# Patient Record
Sex: Female | Born: 1945 | Race: White | Hispanic: No | Marital: Single | Smoking: Former smoker
Health system: Southern US, Community
[De-identification: ages and names within clinical notes are randomized; demographics above are authoritative.]

## PROBLEM LIST (undated history)

## (undated) DIAGNOSIS — R0602 Shortness of breath: Secondary | ICD-10-CM

## (undated) DIAGNOSIS — G20A1 Parkinson's disease without dyskinesia, without mention of fluctuations: Secondary | ICD-10-CM

## (undated) DIAGNOSIS — G20 Parkinson's disease: Secondary | ICD-10-CM

## (undated) DIAGNOSIS — M1990 Unspecified osteoarthritis, unspecified site: Secondary | ICD-10-CM

## (undated) DIAGNOSIS — H3530 Unspecified macular degeneration: Secondary | ICD-10-CM

## (undated) DIAGNOSIS — E039 Hypothyroidism, unspecified: Secondary | ICD-10-CM

## (undated) DIAGNOSIS — N390 Urinary tract infection, site not specified: Secondary | ICD-10-CM

## (undated) DIAGNOSIS — E079 Disorder of thyroid, unspecified: Secondary | ICD-10-CM

## (undated) DIAGNOSIS — K5900 Constipation, unspecified: Secondary | ICD-10-CM

## (undated) DIAGNOSIS — F419 Anxiety disorder, unspecified: Secondary | ICD-10-CM

## (undated) DIAGNOSIS — S42409A Unspecified fracture of lower end of unspecified humerus, initial encounter for closed fracture: Secondary | ICD-10-CM

## (undated) HISTORY — PX: CHOLECYSTECTOMY: SHX55

## (undated) HISTORY — PX: OTHER SURGICAL HISTORY: SHX169

---

## 2008-01-15 ENCOUNTER — Emergency Department (HOSPITAL_COMMUNITY): Admission: EM | Admit: 2008-01-15 | Discharge: 2008-01-15 | Payer: Self-pay | Admitting: Emergency Medicine

## 2013-02-27 ENCOUNTER — Emergency Department (HOSPITAL_COMMUNITY)
Admission: EM | Admit: 2013-02-27 | Discharge: 2013-02-27 | Disposition: A | Discharge status: Home / Self Care | Payer: Medicare HMO | Attending: Emergency Medicine | Admitting: Emergency Medicine

## 2013-02-27 ENCOUNTER — Emergency Department (HOSPITAL_COMMUNITY): Payer: Medicare HMO

## 2013-02-27 ENCOUNTER — Encounter (HOSPITAL_COMMUNITY): Payer: Self-pay | Admitting: Emergency Medicine

## 2013-02-27 DIAGNOSIS — Y929 Unspecified place or not applicable: Secondary | ICD-10-CM | POA: Insufficient documentation

## 2013-02-27 DIAGNOSIS — S52201A Unspecified fracture of shaft of right ulna, initial encounter for closed fracture: Secondary | ICD-10-CM

## 2013-02-27 DIAGNOSIS — G20 Parkinson's disease: Secondary | ICD-10-CM | POA: Insufficient documentation

## 2013-02-27 DIAGNOSIS — F17200 Nicotine dependence, unspecified, uncomplicated: Secondary | ICD-10-CM | POA: Insufficient documentation

## 2013-02-27 DIAGNOSIS — Z87440 Personal history of urinary (tract) infections: Secondary | ICD-10-CM | POA: Insufficient documentation

## 2013-02-27 DIAGNOSIS — E079 Disorder of thyroid, unspecified: Secondary | ICD-10-CM | POA: Insufficient documentation

## 2013-02-27 DIAGNOSIS — Z8781 Personal history of (healed) traumatic fracture: Secondary | ICD-10-CM | POA: Insufficient documentation

## 2013-02-27 DIAGNOSIS — Y9389 Activity, other specified: Secondary | ICD-10-CM | POA: Insufficient documentation

## 2013-02-27 DIAGNOSIS — S52509A Unspecified fracture of the lower end of unspecified radius, initial encounter for closed fracture: Secondary | ICD-10-CM | POA: Insufficient documentation

## 2013-02-27 DIAGNOSIS — W208XXA Other cause of strike by thrown, projected or falling object, initial encounter: Secondary | ICD-10-CM | POA: Insufficient documentation

## 2013-02-27 DIAGNOSIS — G20A1 Parkinson's disease without dyskinesia, without mention of fluctuations: Secondary | ICD-10-CM | POA: Insufficient documentation

## 2013-02-27 HISTORY — DX: Parkinson's disease without dyskinesia, without mention of fluctuations: G20.A1

## 2013-02-27 HISTORY — DX: Unspecified fracture of lower end of unspecified humerus, initial encounter for closed fracture: S42.409A

## 2013-02-27 HISTORY — DX: Parkinson's disease: G20

## 2013-02-27 HISTORY — DX: Urinary tract infection, site not specified: N39.0

## 2013-02-27 HISTORY — DX: Disorder of thyroid, unspecified: E07.9

## 2013-02-27 MED ORDER — SODIUM CHLORIDE 0.9 % IV SOLN
INTRAVENOUS | Status: DC
  Administered 2013-02-27: 21:00:00 via INTRAVENOUS

## 2013-02-27 MED ORDER — FENTANYL CITRATE 0.05 MG/ML IJ SOLN
50.0000 ug | INTRAMUSCULAR | Status: DC | PRN
  Administered 2013-02-27: 50 ug via INTRAVENOUS
  Filled 2013-02-27: qty 2

## 2013-02-27 MED ORDER — OXYCODONE-ACETAMINOPHEN 5-325 MG PO TABS: 1.0000 | ORAL_TABLET | ORAL | Status: DC | PRN

## 2013-02-27 MED ORDER — LIDOCAINE HCL 1 % IJ SOLN
10.0000 mL | Freq: Once | INTRAMUSCULAR | Status: AC
  Administered 2013-02-27: 10 mL via INTRADERMAL
  Filled 2013-02-27: qty 20

## 2013-02-27 MED ORDER — BUPIVACAINE HCL 0.25 % IJ SOLN
10.0000 mL | Freq: Once | INTRAMUSCULAR | Status: AC
  Administered 2013-02-27: 10 mL
  Filled 2013-02-27: qty 10

## 2013-02-27 MED ORDER — ONDANSETRON HCL 4 MG/2ML IJ SOLN
4.0000 mg | Freq: Once | INTRAMUSCULAR | Status: AC
  Administered 2013-02-27: 4 mg via INTRAVENOUS
  Filled 2013-02-27: qty 2

## 2013-02-27 MED ORDER — DOCUSATE SODIUM 100 MG PO CAPS
100.0000 mg | ORAL_CAPSULE | Freq: Two times a day (BID) | ORAL | Status: AC | PRN
Start: 2013-02-27 — End: ?

## 2013-02-27 NOTE — ED Notes (Signed)
Pt states she was cleaning out a cabinet and some wood stain fell out and caused her to fall  Pt injured her right wrist  Pt has a deformity noted

## 2013-02-27 NOTE — ED Provider Notes (Signed)
CSN: [id number]     Arrival date & time 02/27/13  1945 History   First MD Initiated Contact with Patient 02/27/13 2000     Chief Complaint  Patient presents with  . Fall  . Wrist Injury   (Consider location/radiation/quality/duration/timing/severity/associated sxs/prior Treatment) HPI  67 year old female with right wrist pain. Patient had mechanical fall just prior to arrival. She was organizing a closet when she lost her balance and fell. She has a history of Parkinson's disease and has baseline balance difficulty. She did not strike her head. She denies any significant pain anywhere aside from her wrist. No numbness or tingling. No intervention prior to arrival.    Past Medical History  Diagnosis Date  . Parkinson disease   . Thyroid disease   . UTI (urinary tract infection)   . Fractured elbow    Past Surgical History  Procedure Laterality Date  . Left elbow surgery    . Trigger finger x 5     . Carpel tunnel    . Cholecystectomy    . Shoulder surgery for spur removal     Family History  Problem Relation Age of Onset  . CAD Other   . Depression Other    History  Substance Use Topics  . Smoking status: Current Every Day Smoker  . Smokeless tobacco: Not on file  . Alcohol Use: No   OB History   Grav Para Term Preterm Abortions TAB SAB Ect Mult Living                 Review of Systems  All systems reviewed and negative, other than as noted in HPI.   Allergies  Review of patient's allergies indicates no known allergies.  Home Medications  No current outpatient prescriptions on file. BP 130/75  Pulse 120  Temp(Src) 98.8 F (37.1 C) (Oral)  Resp 16  Ht 5\' 6"  (1.676 m)  Wt 150 lb (68.04 kg)  BMI 24.22 kg/m2  SpO2 95% Physical Exam  Nursing note and vitals reviewed. Constitutional: She appears well-developed and well-nourished. No distress.  HENT:  Head: Normocephalic and atraumatic.  Eyes: Conjunctivae are normal. Right eye exhibits no discharge. Left  eye exhibits no discharge.  Neck: Neck supple.  Cardiovascular: Normal rate, regular rhythm and normal heart sounds.  Exam reveals no gallop and no friction rub.   No murmur heard. Pulmonary/Chest: Effort normal and breath sounds normal. No respiratory distress.  Abdominal: Soft. She exhibits no distension. There is no tenderness.  Musculoskeletal: She exhibits tenderness. She exhibits no edema.  "[place]" deformity to the right wrist. Overlying hematoma. No evidence of open injury. Decreased strength likely 2/2 pain, but otherwise NVI. No elbow tenderness or significant pain with ROM.  No midline spinal tenderness.   Neurological: She is alert.  Skin: Skin is warm and dry.  Psychiatric: She has a normal mood and affect. Her behavior is normal. Thought content normal.    ED Course  Reduction of fracture Date/Time: 02/27/2013 9:00 PM Performed by: Raeford Razor Authorized by: Raeford Razor Consent: Verbal consent obtained. Consent given by: patient Required items: required blood products, implants, devices, and special equipment available Patient identity confirmed: verbally with patient Time out: Immediately prior to procedure a "time out" was called to verify the correct patient, procedure, equipment, support staff and site/side marked as required. Local anesthesia used: yes Anesthesia: hematoma block Local anesthetic: bupivacaine 0.25% without epinephrine and lidocaine 1% without epinephrine Anesthetic total: 10 ml Patient sedated: no Patient tolerance: Patient tolerated the  procedure well with no immediate complications. Comments: Hematoma block. Manual manipulation. Finger traps.    (including critical care time) Labs Review Labs Reviewed - No data to display Imaging Review No results found.  Dg Wrist Complete Right  02/27/2013   *RADIOLOGY REPORT*  Clinical Data: Status post reduction of right wrist injury.  RIGHT WRIST - COMPLETE 3+ VIEW  Comparison: Right wrist  radiographs performed earlier today at 08:31 p.m.  Findings: The comminuted mildly impacted fracture of the distal radial metaphysis demonstrates improved alignment, though minimal dorsal displacement is still seen. Fragments demonstrate both radial and ulnar displacement.  The carpal rows articulate with the distal radial fragment.  Two ulnar styloid fragments are noted.  No new fractures identified. The soft tissues are difficult to fully assess due to the overlying splint.  IMPRESSION: Comminuted mildly impacted fracture of the distal radial metaphysis demonstrates improved alignment, though minimal dorsal displacement is still seen.  Fragments demonstrate both radial and ulnar displacement.  Two ulnar styloid fragments noted.   Original Report Authenticated By: Tonia Ghent, M.D.   Dg Wrist Complete Right  02/27/2013   CLINICAL DATA:  Fall. Wrist injury. Wrist deformity. Wrist pain.  EXAM: RIGHT WRIST - COMPLETE 3+ VIEW  COMPARISON:  None.  FINDINGS: There is a dorsally displaced transverse distal radius fracture. Dorsal displacement is 1 shaft width. The fracture is through the distal radial metaphysis and is comminuted with intra-articular extension at the distal radioulnar joint and radiocarpal joint. 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 Mildly displaced ulnar styloid fracture is present, likely acute. The exam is degraded by nonstandard projections, likely due to the fracture.  IMPRESSION: Transverse comminuted posteriorly displaced distal radius fracture with mildly displaced ulnar styloid fracture.   Electronically Signed   By: Andreas Newport M.D.   On: 02/27/2013 20:44   EKG Interpretation   None       MDM   1. Closed fracture of right radius and ulna, initial encounter     9:17 PM  66yF with R radius fx. Closed injury. Needs reduced. Ortho FU.     Raeford Razor, MD 03/03/13 [id number]

## 2013-02-27 NOTE — ED Notes (Signed)
Patient transported to X-ray 

## 2013-03-04 ENCOUNTER — Encounter (HOSPITAL_COMMUNITY): Payer: Self-pay | Admitting: *Deleted

## 2013-03-04 ENCOUNTER — Other Ambulatory Visit (HOSPITAL_COMMUNITY): Payer: Self-pay

## 2013-03-04 ENCOUNTER — Other Ambulatory Visit (HOSPITAL_COMMUNITY): Payer: Self-pay | Admitting: Orthopaedic Surgery

## 2013-03-04 ENCOUNTER — Encounter: Payer: Self-pay | Admitting: Orthopaedic Surgery

## 2013-03-04 DIAGNOSIS — S52501A Unspecified fracture of the lower end of right radius, initial encounter for closed fracture: Secondary | ICD-10-CM | POA: Insufficient documentation

## 2013-03-04 NOTE — Progress Notes (Signed)
Pt reported that she had an EKG and ECHO at [facility] in [place], approximately 8 months ago.  "They thought I had  a stroke, but I didn't. I requested D/C notes EKG and ECHO.

## 2013-03-05 ENCOUNTER — Encounter (HOSPITAL_COMMUNITY): Payer: Self-pay | Admitting: *Deleted

## 2013-03-05 ENCOUNTER — Ambulatory Visit (HOSPITAL_COMMUNITY)
Admission: RE | Admit: 2013-03-05 | Discharge: 2013-03-05 | Disposition: A | Discharge status: Home / Self Care | Payer: Medicare HMO | Source: Ambulatory Visit | Attending: Orthopaedic Surgery | Admitting: Orthopaedic Surgery

## 2013-03-05 ENCOUNTER — Encounter (HOSPITAL_COMMUNITY)
Admission: RE | Disposition: A | Discharge status: Home / Self Care | Payer: Self-pay | Source: Ambulatory Visit | Attending: Orthopaedic Surgery

## 2013-03-05 ENCOUNTER — Ambulatory Visit (HOSPITAL_COMMUNITY): Payer: Medicare HMO

## 2013-03-05 ENCOUNTER — Ambulatory Visit (HOSPITAL_COMMUNITY): Payer: Medicare HMO | Admitting: Anesthesiology

## 2013-03-05 ENCOUNTER — Encounter (HOSPITAL_COMMUNITY): Payer: Medicare HMO | Admitting: Anesthesiology

## 2013-03-05 DIAGNOSIS — G20 Parkinson's disease: Secondary | ICD-10-CM | POA: Insufficient documentation

## 2013-03-05 DIAGNOSIS — Z79899 Other long term (current) drug therapy: Secondary | ICD-10-CM | POA: Insufficient documentation

## 2013-03-05 DIAGNOSIS — G20A1 Parkinson's disease without dyskinesia, without mention of fluctuations: Secondary | ICD-10-CM | POA: Insufficient documentation

## 2013-03-05 DIAGNOSIS — S52599A Other fractures of lower end of unspecified radius, initial encounter for closed fracture: Secondary | ICD-10-CM | POA: Insufficient documentation

## 2013-03-05 DIAGNOSIS — X58XXXA Exposure to other specified factors, initial encounter: Secondary | ICD-10-CM | POA: Insufficient documentation

## 2013-03-05 DIAGNOSIS — S52501A Unspecified fracture of the lower end of right radius, initial encounter for closed fracture: Secondary | ICD-10-CM

## 2013-03-05 HISTORY — DX: Shortness of breath: R06.02

## 2013-03-05 HISTORY — DX: Hypothyroidism, unspecified: E03.9

## 2013-03-05 HISTORY — PX: OPEN REDUCTION INTERNAL FIXATION (ORIF) DISTAL RADIAL FRACTURE: SHX5989

## 2013-03-05 HISTORY — DX: Unspecified osteoarthritis, unspecified site: M19.90

## 2013-03-05 HISTORY — DX: Unspecified macular degeneration: H35.30

## 2013-03-05 HISTORY — DX: Anxiety disorder, unspecified: F41.9

## 2013-03-05 HISTORY — DX: Constipation, unspecified: K59.00

## 2013-03-05 LAB — BASIC METABOLIC PANEL
BUN: 10 mg/dL (ref 6–23)
Calcium: 8.4 mg/dL (ref 8.4–10.5)
Chloride: 104 mEq/L (ref 96–112)
Creatinine, Ser: 0.38 mg/dL — ABNORMAL LOW (ref 0.50–1.10)
GFR calc Af Amer: >90 mL/min (ref >90)
GFR calc non Af Amer: >90 mL/min (ref >90)
Glucose, Bld: 94 mg/dL (ref 70–99)

## 2013-03-05 LAB — CBC
MCH: 34.3 pg — ABNORMAL HIGH (ref 26.0–34.0)
MCV: 97.8 fL (ref 78.0–100.0)
Platelets: 255 10*3/uL (ref 150–400)
RDW: 14.8 % (ref 11.5–15.5)
WBC: 4.1 10*3/uL (ref 4.0–10.5)

## 2013-03-05 SURGERY — OPEN REDUCTION INTERNAL FIXATION (ORIF) DISTAL RADIUS FRACTURE
Anesthesia: General | Site: Wrist | Laterality: Right | Wound class: Clean

## 2013-03-05 MED ORDER — ROCURONIUM BROMIDE 100 MG/10ML IV SOLN
INTRAVENOUS | Status: DC | PRN
  Administered 2013-03-05: 30 mg via INTRAVENOUS

## 2013-03-05 MED ORDER — CEFAZOLIN SODIUM-DEXTROSE 2-3 GM-% IV SOLR
2.0000 g | INTRAVENOUS | Status: AC
  Administered 2013-03-05: 2 g via INTRAVENOUS
  Filled 2013-03-05: qty 50

## 2013-03-05 MED ORDER — FENTANYL CITRATE 0.05 MG/ML IJ SOLN
INTRAMUSCULAR | Status: DC | PRN
  Administered 2013-03-05: 100 ug via INTRAVENOUS

## 2013-03-05 MED ORDER — LACTATED RINGERS IV SOLN
INTRAVENOUS | Status: DC | PRN
  Administered 2013-03-05 (×2): via INTRAVENOUS

## 2013-03-05 MED ORDER — 0.9 % SODIUM CHLORIDE (POUR BTL) OPTIME
TOPICAL | Status: DC | PRN
  Administered 2013-03-05: 1000 mL

## 2013-03-05 MED ORDER — PROMETHAZINE HCL 25 MG PO TABS: 25.0000 mg | ORAL_TABLET | Freq: Four times a day (QID) | ORAL | Status: DC | PRN

## 2013-03-05 MED ORDER — NEOSTIGMINE METHYLSULFATE 1 MG/ML IJ SOLN
INTRAMUSCULAR | Status: DC | PRN
  Administered 2013-03-05: 3 mg via INTRAVENOUS

## 2013-03-05 MED ORDER — ONDANSETRON HCL 4 MG/2ML IJ SOLN
INTRAMUSCULAR | Status: DC | PRN
  Administered 2013-03-05: 4 mg via INTRAMUSCULAR

## 2013-03-05 MED ORDER — PHENYLEPHRINE HCL 10 MG/ML IJ SOLN
INTRAMUSCULAR | Status: DC | PRN
  Administered 2013-03-05 (×2): 80 ug via INTRAVENOUS

## 2013-03-05 MED ORDER — CELECOXIB 200 MG PO CAPS: 200.0000 mg | ORAL_CAPSULE | Freq: Two times a day (BID) | ORAL | Status: AC

## 2013-03-05 MED ORDER — LIDOCAINE HCL (CARDIAC) 20 MG/ML IV SOLN
INTRAVENOUS | Status: DC | PRN
  Administered 2013-03-05: 5 mg via INTRAVENOUS

## 2013-03-05 MED ORDER — MIDAZOLAM HCL 5 MG/5ML IJ SOLN
INTRAMUSCULAR | Status: DC | PRN
  Administered 2013-03-05: 2 mg via INTRAVENOUS

## 2013-03-05 MED ORDER — HYDROMORPHONE HCL PF 1 MG/ML IJ SOLN: 0.2500 mg | INTRAMUSCULAR | Status: DC | PRN

## 2013-03-05 MED ORDER — VITAMIN C 500 MG PO TABS: 500.0000 mg | ORAL_TABLET | Freq: Two times a day (BID) | ORAL | Status: AC

## 2013-03-05 MED ORDER — PROPOFOL 10 MG/ML IV BOLUS
INTRAVENOUS | Status: DC | PRN
  Administered 2013-03-05: 150 mg via INTRAVENOUS

## 2013-03-05 MED ORDER — OXYCODONE HCL 5 MG PO TABS: 5.0000 mg | ORAL_TABLET | ORAL | Status: DC | PRN

## 2013-03-05 MED ORDER — GLYCOPYRROLATE 0.2 MG/ML IJ SOLN
INTRAMUSCULAR | Status: DC | PRN
  Administered 2013-03-05: .5 mg via INTRAVENOUS

## 2013-03-05 MED ORDER — CALCIUM CARBONATE-VITAMIN D 500-200 MG-UNIT PO TABS: 1.0000 | ORAL_TABLET | Freq: Every day | ORAL | Status: AC

## 2013-03-05 MED ORDER — LACTATED RINGERS IV SOLN
INTRAVENOUS | Status: DC
  Administered 2013-03-05: 13:00:00 via INTRAVENOUS

## 2013-03-05 MED ORDER — HYDROCODONE-ACETAMINOPHEN 5-325 MG PO TABS: 1.0000 | ORAL_TABLET | Freq: Four times a day (QID) | ORAL | Status: AC | PRN

## 2013-03-05 SURGICAL SUPPLY — 66 items
BANDAGE ELASTIC 4 VELCRO ST LF (GAUZE/BANDAGES/DRESSINGS) ×2 IMPLANT
BANDAGE GAUZE ELAST BULKY 4 IN (GAUZE/BANDAGES/DRESSINGS) ×2 IMPLANT
BIT DRILL 2.0 LNG QUCK RELEASE (BIT) ×1 IMPLANT
BIT DRILL 2.8X5 QR DISP (BIT) ×2 IMPLANT
BNDG ESMARK 4X9 LF (GAUZE/BANDAGES/DRESSINGS) ×2 IMPLANT
CLOTH BEACON ORANGE TIMEOUT ST (SAFETY) ×2 IMPLANT
CORDS BIPOLAR (ELECTRODE) ×2 IMPLANT
COVER SURGICAL LIGHT HANDLE (MISCELLANEOUS) ×2 IMPLANT
CUFF TOURNIQUET SINGLE 18IN (TOURNIQUET CUFF) ×2 IMPLANT
CUFF TOURNIQUET SINGLE 24IN (TOURNIQUET CUFF) IMPLANT
DERMABOND ADHESIVE PROPEN (GAUZE/BANDAGES/DRESSINGS) ×1
DERMABOND ADVANCED .7 DNX6 (GAUZE/BANDAGES/DRESSINGS) ×1 IMPLANT
DRAPE C-ARM 42X72 X-RAY (DRAPES) ×2 IMPLANT
DRAPE OEC MINIVIEW 54X84 (DRAPES) ×2 IMPLANT
DRILL 2.0 LNG QUICK RELEASE (BIT) ×2
DURAPREP 26ML APPLICATOR (WOUND CARE) ×2 IMPLANT
ELECT REM PT RETURN 9FT ADLT (ELECTROSURGICAL) ×2
ELECTRODE REM PT RTRN 9FT ADLT (ELECTROSURGICAL) ×1 IMPLANT
GAUZE XEROFORM 1X8 LF (GAUZE/BANDAGES/DRESSINGS) ×2 IMPLANT
GLOVE BIO SURGEON STRL SZ8 (GLOVE) ×2 IMPLANT
GLOVE BIOGEL PI IND STRL 7.5 (GLOVE) ×2 IMPLANT
GLOVE BIOGEL PI IND STRL 8 (GLOVE) ×1 IMPLANT
GLOVE BIOGEL PI INDICATOR 7.5 (GLOVE) ×2
GLOVE BIOGEL PI INDICATOR 8 (GLOVE) ×1
GLOVE ORTHO TXT STRL SZ7.5 (GLOVE) ×2 IMPLANT
GOWN PREVENTION PLUS LG XLONG (DISPOSABLE) IMPLANT
GOWN PREVENTION PLUS XLARGE (GOWN DISPOSABLE) ×4 IMPLANT
GOWN STRL NON-REIN LRG LVL3 (GOWN DISPOSABLE) ×2 IMPLANT
GUIDEWIRE ORTHO 0.054X6 (WIRE) ×4 IMPLANT
KIT BASIN OR (CUSTOM PROCEDURE TRAY) ×2 IMPLANT
KIT ROOM TURNOVER OR (KITS) ×2 IMPLANT
MANIFOLD NEPTUNE II (INSTRUMENTS) ×2 IMPLANT
NEEDLE 22X1 1/2 (OR ONLY) (NEEDLE) IMPLANT
NS IRRIG 1000ML POUR BTL (IV SOLUTION) ×2 IMPLANT
PACK ORTHO EXTREMITY (CUSTOM PROCEDURE TRAY) ×2 IMPLANT
PAD ARMBOARD 7.5X6 YLW CONV (MISCELLANEOUS) ×4 IMPLANT
PAD CAST 4YDX4 CTTN HI CHSV (CAST SUPPLIES) ×1 IMPLANT
PADDING CAST COTTON 4X4 STRL (CAST SUPPLIES) ×1
PLATE R NARROW PROC VDR (Plate) ×2 IMPLANT
SCREW 2.3X12MM (Screw) ×2 IMPLANT
SCREW BN FT 16X2.3XLCK HEX CRT (Screw) ×1 IMPLANT
SCREW CORT FT 18X2.3XLCK HEX (Screw) ×2 IMPLANT
SCREW CORT FT 20X2.3XLCK HEX (Screw) ×1 IMPLANT
SCREW CORTICAL LOCKING 2.3X14M (Screw) ×2 IMPLANT
SCREW CORTICAL LOCKING 2.3X16M (Screw) ×1 IMPLANT
SCREW CORTICAL LOCKING 2.3X18M (Screw) ×2 IMPLANT
SCREW CORTICAL LOCKING 2.3X20M (Screw) ×1 IMPLANT
SCREW HEXALOBE NON-LOCK 3.5X16 (Screw) ×2 IMPLANT
SCREW NON TOGG 2.3X18MM (Screw) ×2 IMPLANT
SCREW NONLOCK HEX 3.5X12 (Screw) ×6 IMPLANT
SPONGE GAUZE 4X4 12PLY (GAUZE/BANDAGES/DRESSINGS) ×2 IMPLANT
SPONGE LAP 4X18 X RAY DECT (DISPOSABLE) ×2 IMPLANT
STRIP CLOSURE SKIN 1/2X4 (GAUZE/BANDAGES/DRESSINGS) ×2 IMPLANT
SUCTION FRAZIER TIP 10 FR DISP (SUCTIONS) ×2 IMPLANT
SUT MNCRL AB 4-0 PS2 18 (SUTURE) ×2 IMPLANT
SUT PROLENE 3 0 PS 1 (SUTURE) ×2 IMPLANT
SUT VIC AB 2-0 CT1 27 (SUTURE) ×1
SUT VIC AB 2-0 CT1 TAPERPNT 27 (SUTURE) ×1 IMPLANT
SUT VIC AB 3-0 X1 27 (SUTURE) ×2 IMPLANT
SUT VICRYL 4-0 PS2 18IN ABS (SUTURE) IMPLANT
SYR CONTROL 10ML LL (SYRINGE) IMPLANT
TOWEL OR 17X24 6PK STRL BLUE (TOWEL DISPOSABLE) ×2 IMPLANT
TOWEL OR 17X26 10 PK STRL BLUE (TOWEL DISPOSABLE) ×2 IMPLANT
TUBE CONNECTING 12X1/4 (SUCTIONS) ×2 IMPLANT
UNDERPAD 30X30 INCONTINENT (UNDERPADS AND DIAPERS) ×2 IMPLANT
WATER STERILE IRR 1000ML POUR (IV SOLUTION) ×2 IMPLANT

## 2013-03-05 NOTE — Anesthesia Preprocedure Evaluation (Addendum)
Anesthesia Evaluation  Patient identified by MRN, date of birth, ID band Patient awake    Reviewed: H&P , Patient's Chart, lab work & pertinent test results  Airway Mallampati: I TM Distance: >3 FB Neck ROM: Full    Dental  (+) Edentulous Upper and Edentulous Lower   Pulmonary    Pulmonary exam normal       Cardiovascular Rhythm:Regular Rate:Normal     Neuro/Psych  Neuromuscular disease    GI/Hepatic   Endo/Other  Hypothyroidism   Renal/GU      Musculoskeletal   Abdominal Normal abdominal exam  (+)   Peds  Hematology   Anesthesia Other Findings   Reproductive/Obstetrics                           Anesthesia Physical Anesthesia Plan  ASA: III  Anesthesia Plan:    Post-op Pain Management:    Induction:   Airway Management Planned:   Additional Equipment:   Intra-op Plan:   Post-operative Plan:   Informed Consent:   Plan Discussed with:   Anesthesia Plan Comments:         Anesthesia Quick Evaluation

## 2013-03-05 NOTE — Anesthesia Postprocedure Evaluation (Signed)
  Anesthesia Post-op Note  Patient: Angel Bass  Procedure(s) Performed: Procedure(s): OPEN REDUCTION INTERNAL FIXATION (ORIF) RIGHT DISTAL RADIUS FRACTURE (Right)  Patient Location: PACU  Anesthesia Type:General  Level of Consciousness: awake, oriented, sedated and patient cooperative  Airway and Oxygen Therapy: Patient Spontanous Breathing  Post-op Pain: mild  Post-op Assessment: Post-op Vital signs reviewed, Patient's Cardiovascular Status Stable, Respiratory Function Stable, Patent Airway, No signs of Nausea or vomiting and Pain level controlled  Post-op Vital Signs: stable  Complications: No apparent anesthesia complications

## 2013-03-05 NOTE — Anesthesia Procedure Notes (Addendum)
Procedure Name: Intubation Date/Time: 03/05/2013 2:55 PM Performed by: Gwenyth Allegra Pre-anesthesia Checklist: Patient identified, Timeout performed, Emergency Drugs available, Suction available and Patient being monitored Patient Re-evaluated:Patient Re-evaluated prior to inductionOxygen Delivery Method: Circle system utilized Preoxygenation: Pre-oxygenation with 100% oxygen Intubation Type: IV induction Ventilation: Oral airway inserted - appropriate to patient size Laryngoscope Size: Mac and 3 Grade View: Grade I Tube size: 7.0 mm Number of attempts: 1 Airway Equipment and Method: Stylet Placement Confirmation: ETT inserted through vocal cords under direct vision,  breath sounds checked- equal and bilateral and positive ETCO2 Secured at: 21 cm Tube secured with: Tape Dental Injury: Teeth and Oropharynx as per pre-operative assessment     Anesthesia Regional Block:  Supraclavicular block  Pre-Anesthetic Checklist: ,, timeout performed, Correct Patient, Correct Site, Correct Laterality, Correct Procedure, Correct Position, site marked, Risks and benefits discussed,  Surgical consent,  Pre-op evaluation,  At surgeon's request and post-op pain management  Laterality: Left  Prep: chloraprep       Needles:   Needle Type: Echogenic Stimulator Needle     Needle Length:cm 9 cm Needle Gauge: 22 and 22 G    Additional Needles:  Procedures: ultrasound guided (picture in chart) and nerve stimulator Supraclavicular block  Nerve Stimulator or Paresthesia:  Response: 1 mA,   Additional Responses:   Narrative:  Start time: 03/05/2013 2:40 PM End time: 03/05/2013 2:45 PM Injection made incrementally with aspirations every 5 mL.  Performed by: Personally   Additional Notes: 30 cc 0.5% marcaine 1:200 epi with 4 mg decadron injected easily.

## 2013-03-05 NOTE — Transfer of Care (Signed)
Immediate Anesthesia Transfer of Care Note  Patient: Angel Bass  Procedure(s) Performed: Procedure(s): OPEN REDUCTION INTERNAL FIXATION (ORIF) RIGHT DISTAL RADIUS FRACTURE (Right)  Patient Location: PACU  Anesthesia Type:General and GA combined with regional for post-op pain  Level of Consciousness: awake, alert  and oriented  Airway & Oxygen Therapy: Patient Spontanous Breathing and Patient connected to nasal cannula oxygen  Post-op Assessment: Report given to PACU RN and Post -op Vital signs reviewed and stable  Post vital signs: Reviewed and stable  Complications: No apparent anesthesia complications

## 2013-03-05 NOTE — Preoperative (Signed)
Beta Blockers   Reason not to administer Beta Blockers:Not Applicable 

## 2013-03-05 NOTE — H&P (Signed)
PREOPERATIVE H&P  Chief Complaint: Right distal radius fracture  HPI: Angel Bass is a 67 y.o. female who presents for operative treatment of her right distal radius fracture.  She has elected for surgical management.   Past Medical History  Diagnosis Date  . Parkinson disease   . Thyroid disease   . UTI (urinary tract infection)   . Fractured elbow   . Hypothyroidism   . Anxiety   . Shortness of breath     with exertion  . Constipation   . Arthritis   . Macular degeneration disease    Past Surgical History  Procedure Laterality Date  . Left elbow surgery    . Trigger finger x 5       thumb- both,   . Carpel tunnel Right   . Cholecystectomy    . Shoulder surgery for spur removal Right    History   Social History  . Marital Status: Single    Spouse Name: N/A    Number of Children: N/A  . Years of Education: N/A   Social History Main Topics  . Smoking status: Current Every Day Smoker -- 0.50 packs/day for 40 years  . Smokeless tobacco: None  . Alcohol Use: No  . Drug Use: No  . Sexual Activity: None   Other Topics Concern  . None   Social History Narrative  . None   Family History  Problem Relation Age of Onset  . CAD Other   . Depression Other    No Known Allergies Prior to Admission medications   Medication Sig Start Date End Date Taking? Authorizing Provider  ALPRAZolam (XANAX) 0.25 MG tablet Take 0.25 mg by mouth 3 (three) times daily as needed for sleep.   Yes Historical Provider, MD  aspirin 325 MG tablet Take 325 mg by mouth daily.   Yes Historical Provider, MD  calcium carbonate (OS-CAL) 600 MG TABS tablet Take 600 mg by mouth 2 (two) times daily with a meal.   Yes Historical Provider, MD  carbidopa-levodopa (SINEMET IR) 25-100 MG per tablet Take 11 tablets by mouth daily. 3 at 7 am, 3 @ 3  @ 11 am, 3 at 3 pm and   3 @@ 6 pm   Yes Historical Provider, MD  Cyanocobalamin (VITAMIN B 12 PO) Take 1 tablet by mouth daily.   Yes Historical Provider, MD   docusate sodium (COLACE) 100 MG capsule Take 1 capsule (100 mg total) by mouth 2 (two) times daily as needed for constipation. 02/27/13  Yes Raeford Razor, MD  ibuprofen (ADVIL,MOTRIN) 200 MG tablet Take 400 mg by mouth every 6 (six) hours as needed for pain.   Yes Historical Provider, MD  levothyroxine (SYNTHROID, LEVOTHROID) 100 MCG tablet Take 100 mcg by mouth daily before breakfast.   Yes Historical Provider, MD  Multiple Vitamins-Minerals (ICAPS PO) Take 1 tablet by mouth daily.   Yes Historical Provider, MD  OVER THE COUNTER MEDICATION Take 2 tablets by mouth every 4 (four) hours as needed (sinus congestion). [employer] sinus medication   Yes Historical Provider, MD  OVER THE COUNTER MEDICATION Take 2 tablets by mouth as needed (for constipation). [employer] laxative   Yes Historical Provider, MD  oxyCODONE-acetaminophen (PERCOCET/ROXICET) 5-325 MG per tablet Take 1-2 tablets by mouth every 4 (four) hours as needed for pain. 02/27/13  Yes Raeford Razor, MD  Probiotic Product (PROBIOTIC DAILY PO) Take 1 capsule by mouth daily.   Yes Historical Provider, MD     Positive ROS: All  other systems have been reviewed and were otherwise negative with the exception of those mentioned in the HPI and as above.  Physical Exam: General: Alert, no acute distress Cardiovascular: No pedal edema Respiratory: No cyanosis, no use of accessory musculature GI: No organomegaly, abdomen is soft and non-tender Skin: No lesions in the area of chief complaint Neurologic: Sensation intact distally Psychiatric: Patient is competent for consent with normal mood and affect Lymphatic: No axillary or cervical lymphadenopathy  MUSCULOSKELETAL:  Well fitting splint. Fingers wwp. Hand NVI. No signs of CTS.  Assessment: Right distal radius fracture  Plan: Plan for Procedure(s): OPEN REDUCTION INTERNAL FIXATION (ORIF) RIGHT DISTAL RADIUS FRACTURE  The risks benefits and alternatives were discussed with the  patient including but not limited to the risks of nonoperative treatment, versus surgical intervention including infection, bleeding, nerve injury,  blood clots, cardiopulmonary complications, morbidity, mortality, among others, and they were willing to proceed.   Cheral Almas, MD   03/05/2013 1:06 PM

## 2013-03-05 NOTE — Op Note (Signed)
   DATE OF SURGERY: 03/05/2013  PREOPERATIVE DIAGNOSIS: Distal radius fracture, right   POSTOPERATIVE DIAGNOSIS: same  PROCEDURE: Open treatment of intraarticular right distal radius, fixation of 3 or more fragments. CPT [phone]   IMPLANT:   Implant Name Type Inv. Item Serial No. Manufacturer Lot No. LRB No. Used  right, narrow proximal vdr plate    [employer]  Right 1  SCREW NON TOGG 2.3X18MM - [id number] Screw [id number]  [employer]  Right 1  SCREW 2.3X12MM - [id number] Screw 3927  [employer]  Right 1  SCREW CORTICAL LOCKING 2.3X14M - [id number] Screw 2602  [employer]  Right 1  SCREW CORTICAL LOCKING 2.3X16M - [id number] Screw 2603  [employer]  Right 1  SCREW CORTICAL LOCKING 2.3X18M - [id number] Screw 6803  [employer]  Right 2  SCREW CORTICAL LOCKING 2.3X20M - [id number] Screw 6804  [employer]  Right 1  SCREW NONLOCK HEX 3.5X12 - [id number] Screw 8413  [employer]  Right 3  SCREW HEXALOBE NON-LOCK 3.5X16 - [id number] Screw 4179   [employer]   Right 1     SURGEON: Surgeon(s) and Role:    * Naiping Glee Arvin, MD - Primary  ANESTHESIA: Choice  TOURNIQUET TIME: 61 minutes  BLOOD LOSS: Minimal.  COMPLICATIONS: None.  PATHOLOGY: None.  TIME OUT: Performed before the start of procedure.  INDICATIONS: The patient is a 67 y.o. female who presented with a displaced distal radius fracture indicated for operative fixation.  DESCRIPTION OF PROCEDURE:  The patient was placed in the supine position. Prophylactic antibiotics were administered prior to incision.  A well padded tourniquet was inflated.  The extremity was prepped and draped in standard sterile fashion. The extremity was exsanguinated, tourniquet inflated to 250 mmHg.  A FCR approach was used.  Dissection through the sheath of FCR was carried out and the FCR tendon was retracted. The floor of FCR sheath was released. Flexor tendons and median nerve were retracted ulnarly and protected. Pronator quadratus was released from distal radius.  Fracture lines were visualized. Fracture was reduced under fluoroscopy. A volar locking plate was applied to the volar surface of the distal radius. Seven distal locking screws and 3 shaft screws were inserted. Fluoroscopy was used to show adequate reduction. The tourniquet was deflated. Hemostasis was achieved. The wound was irrigated. Incision closed in layers. Sterile dressing applied. Wrist immobilized in a cast. The patient was transferred to the recovery room in stable condition after all counts were correct.  POSTOPERATIVE PLAN: The patient will remain in the cast until instructed. Sutures will be removed in two weeks. About four weeks after surgery, will start wrist range of motion exercises, but in the meantime before then she will start aggressive finger range of motion exercises without resistance.  The "six pack of hand exercises" handout was given to the patient.  Mayra Reel, MD [facility] [phone] 4:53 PM

## 2013-03-11 ENCOUNTER — Encounter (HOSPITAL_COMMUNITY): Payer: Self-pay | Admitting: Orthopaedic Surgery

## 2014-05-09 ENCOUNTER — Emergency Department (HOSPITAL_COMMUNITY): Payer: Medicare HMO

## 2014-05-09 ENCOUNTER — Inpatient Hospital Stay (HOSPITAL_COMMUNITY)
Admission: EM | Admit: 2014-05-09 | Discharge: 2014-05-12 | DRG: 177 | Disposition: A | Discharge status: Home / Self Care | Payer: Medicare HMO | Attending: Internal Medicine | Admitting: Internal Medicine

## 2014-05-09 ENCOUNTER — Encounter (HOSPITAL_COMMUNITY): Payer: Self-pay | Admitting: Emergency Medicine

## 2014-05-09 DIAGNOSIS — E876 Hypokalemia: Secondary | ICD-10-CM | POA: Diagnosis present

## 2014-05-09 DIAGNOSIS — B379 Candidiasis, unspecified: Secondary | ICD-10-CM | POA: Diagnosis present

## 2014-05-09 DIAGNOSIS — Z66 Do not resuscitate: Secondary | ICD-10-CM | POA: Diagnosis present

## 2014-05-09 DIAGNOSIS — Z6823 Body mass index (BMI) 23.0-23.9, adult: Secondary | ICD-10-CM | POA: Diagnosis not present

## 2014-05-09 DIAGNOSIS — E43 Unspecified severe protein-calorie malnutrition: Secondary | ICD-10-CM | POA: Diagnosis present

## 2014-05-09 DIAGNOSIS — H3530 Unspecified macular degeneration: Secondary | ICD-10-CM | POA: Diagnosis present

## 2014-05-09 DIAGNOSIS — F17210 Nicotine dependence, cigarettes, uncomplicated: Secondary | ICD-10-CM | POA: Diagnosis present

## 2014-05-09 DIAGNOSIS — Z993 Dependence on wheelchair: Secondary | ICD-10-CM | POA: Diagnosis not present

## 2014-05-09 DIAGNOSIS — R1311 Dysphagia, oral phase: Secondary | ICD-10-CM | POA: Diagnosis present

## 2014-05-09 DIAGNOSIS — R4781 Slurred speech: Secondary | ICD-10-CM

## 2014-05-09 DIAGNOSIS — Z8673 Personal history of transient ischemic attack (TIA), and cerebral infarction without residual deficits: Secondary | ICD-10-CM

## 2014-05-09 DIAGNOSIS — N390 Urinary tract infection, site not specified: Secondary | ICD-10-CM | POA: Diagnosis present

## 2014-05-09 DIAGNOSIS — J690 Pneumonitis due to inhalation of food and vomit: Principal | ICD-10-CM | POA: Diagnosis present

## 2014-05-09 DIAGNOSIS — E039 Hypothyroidism, unspecified: Secondary | ICD-10-CM | POA: Diagnosis present

## 2014-05-09 DIAGNOSIS — D649 Anemia, unspecified: Secondary | ICD-10-CM | POA: Diagnosis present

## 2014-05-09 DIAGNOSIS — Z79899 Other long term (current) drug therapy: Secondary | ICD-10-CM

## 2014-05-09 DIAGNOSIS — Z87440 Personal history of urinary (tract) infections: Secondary | ICD-10-CM

## 2014-05-09 DIAGNOSIS — G4089 Other seizures: Secondary | ICD-10-CM | POA: Diagnosis present

## 2014-05-09 DIAGNOSIS — R0602 Shortness of breath: Secondary | ICD-10-CM | POA: Diagnosis not present

## 2014-05-09 DIAGNOSIS — M1990 Unspecified osteoarthritis, unspecified site: Secondary | ICD-10-CM | POA: Diagnosis present

## 2014-05-09 DIAGNOSIS — J189 Pneumonia, unspecified organism: Secondary | ICD-10-CM | POA: Diagnosis present

## 2014-05-09 DIAGNOSIS — G20 Parkinson's disease: Secondary | ICD-10-CM | POA: Diagnosis present

## 2014-05-09 DIAGNOSIS — R1313 Dysphagia, pharyngeal phase: Secondary | ICD-10-CM | POA: Diagnosis present

## 2014-05-09 DIAGNOSIS — R569 Unspecified convulsions: Secondary | ICD-10-CM

## 2014-05-09 LAB — URINALYSIS, ROUTINE W REFLEX MICROSCOPIC
Bilirubin Urine: NEGATIVE
Glucose, UA: NEGATIVE mg/dL
Hgb urine dipstick: NEGATIVE
KETONES UR: NEGATIVE mg/dL
Nitrite: POSITIVE — AB
PROTEIN: NEGATIVE mg/dL
Specific Gravity, Urine: 1.02 (ref 1.005–1.030)
Urobilinogen, UA: 0.2 mg/dL (ref 0.0–1.0)
pH: 7 (ref 5.0–8.0)

## 2014-05-09 LAB — COMPREHENSIVE METABOLIC PANEL
ALT: <5 U/L (ref 0–35)
ANION GAP: 14 (ref 5–15)
Albumin: 2.9 g/dL — ABNORMAL LOW (ref 3.5–5.2)
Alkaline Phosphatase: 110 U/L (ref 39–117)
BILIRUBIN TOTAL: 0.4 mg/dL (ref 0.3–1.2)
BUN: 11 mg/dL (ref 6–23)
CALCIUM: 8.9 mg/dL (ref 8.4–10.5)
CO2: 25 meq/L (ref 19–32)
CREATININE: 0.45 mg/dL — AB (ref 0.50–1.10)
Chloride: 97 mEq/L (ref 96–112)
Glucose, Bld: 115 mg/dL — ABNORMAL HIGH (ref 70–99)
Potassium: 3.6 mEq/L — ABNORMAL LOW (ref 3.7–5.3)
Sodium: 136 mEq/L — ABNORMAL LOW (ref 137–147)
Total Protein: 6.3 g/dL (ref 6.0–8.3)

## 2014-05-09 LAB — CBC WITH DIFFERENTIAL/PLATELET
Basophils Absolute: 0 10*3/uL (ref 0.0–0.1)
Basophils Relative: 0 % (ref 0–1)
EOS PCT: 7 % — AB (ref 0–5)
Eosinophils Absolute: 0.3 10*3/uL (ref 0.0–0.7)
HCT: 27.8 % — ABNORMAL LOW (ref 36.0–46.0)
Hemoglobin: 9.3 g/dL — ABNORMAL LOW (ref 12.0–15.0)
LYMPHS PCT: 18 % (ref 12–46)
Lymphs Abs: 0.7 10*3/uL (ref 0.7–4.0)
MCH: 31.5 pg (ref 26.0–34.0)
MCHC: 33.5 g/dL (ref 30.0–36.0)
MCV: 94.2 fL (ref 78.0–100.0)
Monocytes Absolute: 0.2 10*3/uL (ref 0.1–1.0)
Monocytes Relative: 5 % (ref 3–12)
NEUTROS PCT: 70 % (ref 43–77)
Neutro Abs: 2.5 10*3/uL (ref 1.7–7.7)
Platelets: 557 10*3/uL — ABNORMAL HIGH (ref 150–400)
RBC: 2.95 MIL/uL — ABNORMAL LOW (ref 3.87–5.11)
RDW: 13.5 % (ref 11.5–15.5)
WBC: 3.6 10*3/uL — AB (ref 4.0–10.5)

## 2014-05-09 LAB — POC OCCULT BLOOD, ED: Fecal Occult Bld: NEGATIVE

## 2014-05-09 LAB — URINE MICROSCOPIC-ADD ON

## 2014-05-09 MED ORDER — DEXTROSE 5 % IV SOLN
1.0000 g | Freq: Once | INTRAVENOUS | Status: AC
  Administered 2014-05-09: 1 g via INTRAVENOUS
  Filled 2014-05-09: qty 10

## 2014-05-09 MED ORDER — CARBIDOPA-LEVODOPA 25-100 MG PO TABS
3.0000 | ORAL_TABLET | ORAL | Status: DC
  Administered 2014-05-10 – 2014-05-12 (×11): 3 via ORAL
  Filled 2014-05-09 (×13): qty 3

## 2014-05-09 MED ORDER — IOHEXOL 300 MG/ML  SOLN
80.0000 mL | Freq: Once | INTRAMUSCULAR | Status: AC | PRN
  Administered 2014-05-09: 80 mL via INTRAVENOUS

## 2014-05-09 MED ORDER — LEVOTHYROXINE SODIUM 100 MCG PO TABS
100.0000 ug | ORAL_TABLET | Freq: Every day | ORAL | Status: DC
  Administered 2014-05-10 – 2014-05-11 (×2): 100 ug via ORAL
  Filled 2014-05-09 (×4): qty 1

## 2014-05-09 MED ORDER — SODIUM CHLORIDE 0.9 % IV BOLUS (SEPSIS)
500.0000 mL | Freq: Once | INTRAVENOUS | Status: AC
  Administered 2014-05-09: 500 mL via INTRAVENOUS

## 2014-05-09 MED ORDER — DOCUSATE SODIUM 100 MG PO CAPS
100.0000 mg | ORAL_CAPSULE | Freq: Two times a day (BID) | ORAL | Status: DC | PRN
  Filled 2014-05-09: qty 1

## 2014-05-09 MED ORDER — ENSURE COMPLETE PO LIQD
237.0000 mL | Freq: Two times a day (BID) | ORAL | Status: DC
  Administered 2014-05-10 – 2014-05-11 (×2): 237 mL via ORAL

## 2014-05-09 MED ORDER — POTASSIUM CHLORIDE 10 MEQ/100ML IV SOLN
10.0000 meq | INTRAVENOUS | Status: AC
  Administered 2014-05-10 (×2): 10 meq via INTRAVENOUS
  Filled 2014-05-09 (×3): qty 100

## 2014-05-09 MED ORDER — DEXTROSE-NACL 5-0.45 % IV SOLN: INTRAVENOUS | Status: AC

## 2014-05-09 MED ORDER — SODIUM CHLORIDE 0.9 % IV SOLN
INTRAVENOUS | Status: DC
  Administered 2014-05-10: 01:00:00 via INTRAVENOUS

## 2014-05-09 MED ORDER — TRAMADOL HCL 50 MG PO TABS: 50.0000 mg | ORAL_TABLET | Freq: Three times a day (TID) | ORAL | Status: DC | PRN

## 2014-05-09 MED ORDER — ATORVASTATIN CALCIUM 10 MG PO TABS
10.0000 mg | ORAL_TABLET | Freq: Every day | ORAL | Status: DC
  Administered 2014-05-10 – 2014-05-12 (×3): 10 mg via ORAL
  Filled 2014-05-09 (×3): qty 1

## 2014-05-09 MED ORDER — ALPRAZOLAM 0.25 MG PO TABS
0.2500 mg | ORAL_TABLET | Freq: Every evening | ORAL | Status: DC | PRN
  Administered 2014-05-10 – 2014-05-11 (×2): 0.25 mg via ORAL
  Filled 2014-05-09 (×2): qty 1

## 2014-05-09 MED ORDER — DEXTROSE 5 % IV SOLN
500.0000 mg | Freq: Once | INTRAVENOUS | Status: AC
  Administered 2014-05-09: 500 mg via INTRAVENOUS
  Filled 2014-05-09: qty 500

## 2014-05-09 MED ORDER — HYDROCODONE-ACETAMINOPHEN 5-325 MG PO TABS
1.0000 | ORAL_TABLET | Freq: Four times a day (QID) | ORAL | Status: DC | PRN
Start: 2014-05-09 — End: 2014-05-12

## 2014-05-09 MED ORDER — PIPERACILLIN-TAZOBACTAM 3.375 G IVPB
3.3750 g | Freq: Three times a day (TID) | INTRAVENOUS | Status: DC
  Administered 2014-05-10: 3.375 g via INTRAVENOUS
  Filled 2014-05-09 (×2): qty 50

## 2014-05-09 MED ORDER — DEXTROSE 5 % IV SOLN
500.0000 mg | INTRAVENOUS | Status: DC
  Administered 2014-05-10 – 2014-05-11 (×2): 500 mg via INTRAVENOUS
  Filled 2014-05-09 (×2): qty 500

## 2014-05-09 NOTE — ED Notes (Addendum)
Patient's son in law approached [facility] desk stating he thinks his mother-in-law had a stroke as they were driving.  Patient had become suddenly weak and her speech became slurred.

## 2014-05-09 NOTE — ED Provider Notes (Signed)
CSN: [id number]     Arrival date & time 05/09/14  1556 History   First MD Initiated Contact with Patient 05/09/14 1609     Chief Complaint  Patient presents with  . Weakness     (Consider location/radiation/quality/duration/timing/severity/associated sxs/prior Treatment) HPI Comments: Patient with a history of Parkinson's presents with a episode of altered mental status. Her son-in-law is with her states that over the last 3-4 weeks she's had some URI type symptoms and had diminished appetite. She hasn't been eating well on his last a fair amount of weight over that time. She's had some cough and chest congestion. She's had generalized weakness. While they were driving today to pick up his knees, she had an episode where she became unresponsive and couldn't get her words out. This lasted about 5 seconds. She was awake at the time but nonresponsive. She didn't have any known facial drooping. There was not any new unilateral extremity weakness but she was able to talk. She is back to baseline now. She denies having any episodes like this in the past. She has chronic worsening lower extremity weakness that's at her baseline. She has difficulty walking at baseline and states that normally she needs to be in a wheelchair. This is unchanged. She has chronic numbness in her legs which is also unchanged from her baseline. She has a chronic tremor to her upper extremities and chronic weakness in her upper extremities which is unchanged from her baseline. She denies any headache. She denies any dysuria but has had a history of recurrent UTIs and was started on an antibiotic for a UTI yesterday which was Bactrim.. She denies any fevers or chills.   Past Medical History  Diagnosis Date  . Parkinson disease   . Thyroid disease   . UTI (urinary tract infection)   . Fractured elbow   . Hypothyroidism   . Anxiety   . Shortness of breath     with exertion  . Constipation   . Arthritis   . Macular  degeneration disease    Past Surgical History  Procedure Laterality Date  . Left elbow surgery    . Trigger finger x 5       thumb- both,   . Carpel tunnel Right   . Cholecystectomy    . Shoulder surgery for spur removal Right   . Open reduction internal fixation (orif) distal radial fracture Right 03/05/2013    Procedure: OPEN REDUCTION INTERNAL FIXATION (ORIF) RIGHT DISTAL RADIUS FRACTURE;  Surgeon: Cheral Almas, MD;  Location: [facility] OR;  Service: Orthopedics;  Laterality: Right;   Family History  Problem Relation Age of Onset  . CAD Other   . Depression Other    History  Substance Use Topics  . Smoking status: Current Every Day Smoker -- 0.50 packs/day for 40 years  . Smokeless tobacco: Not on file  . Alcohol Use: No   OB History    No data available     Review of Systems  Constitutional: Positive for appetite change and fatigue. Negative for fever, chills and diaphoresis.  HENT: Positive for congestion. Negative for rhinorrhea and sneezing.   Eyes: Negative.   Respiratory: Positive for cough. Negative for chest tightness and shortness of breath.   Cardiovascular: Negative for chest pain and leg swelling.  Gastrointestinal: Positive for nausea and vomiting. Negative for abdominal pain, diarrhea and blood in stool.       Decreased appetite, with occasional nausea and vomiting  Genitourinary: Negative for frequency, hematuria, flank  pain and difficulty urinating.  Musculoskeletal: Negative for back pain and arthralgias.  Skin: Negative for rash.  Neurological: Positive for weakness (ggeneralized but worse in the lower extremities) and numbness. Negative for dizziness, speech difficulty and headaches.      Allergies  Review of patient's allergies indicates no known allergies.  Home Medications   Prior to Admission medications   Medication Sig Start Date End Date Taking? Authorizing Provider  atorvastatin (LIPITOR) 10 MG tablet Take 10 mg by mouth daily.   Yes  Historical Provider, MD  calcium carbonate (OS-CAL) 600 MG TABS tablet Take 600 mg by mouth 2 (two) times daily with a meal.   Yes Historical Provider, MD  carbidopa-levodopa (SINEMET IR) 25-100 MG per tablet Take 3 tablets by mouth 4 (four) times daily. Take 3 Tablets at 7:30 am, Take 3 Tablets at 11:30 am, Take 3 Tablets at 2:30 pm & Take 3 Tablets at 5:30 pm.   Yes Historical Provider, MD  Cyanocobalamin (VITAMIN B 12 PO) Take 1 tablet by mouth daily.   Yes Historical Provider, MD  ibuprofen (ADVIL,MOTRIN) 200 MG tablet Take 400 mg by mouth daily.    Yes Historical Provider, MD  levothyroxine (SYNTHROID, LEVOTHROID) 100 MCG tablet Take 100 mcg by mouth daily before breakfast.   Yes Historical Provider, MD  Multiple Vitamins-Minerals (ICAPS PO) Take 1 tablet by mouth daily.   Yes Historical Provider, MD  Probiotic Product (PROBIOTIC DAILY PO) Take 1 capsule by mouth daily.   Yes Historical Provider, MD  sulfamethoxazole-trimethoprim (BACTRIM DS,SEPTRA DS) 800-160 MG per tablet Take 1 tablet by mouth daily. For 7 Days.   Yes Historical Provider, MD  traMADol (ULTRAM) 50 MG tablet Take 50 mg by mouth 3 (three) times daily as needed for moderate pain or severe pain (pain).   Yes Historical Provider, MD  ALPRAZolam (XANAX) 0.25 MG tablet Take 0.25 mg by mouth 3 (three) times daily as needed for sleep.    Historical Provider, MD  calcium-vitamin D (OSCAL WITH D) 500-200 MG-UNIT per tablet Take 1 tablet by mouth daily with breakfast. Patient not taking: Reported on 05/09/2014 03/05/13   Naiping Glee Arvin, MD  celecoxib (CELEBREX) 200 MG capsule Take 1 capsule (200 mg total) by mouth 2 (two) times daily. Patient not taking: Reported on 05/09/2014 03/05/13   Naiping Glee Arvin, MD  docusate sodium (COLACE) 100 MG capsule Take 1 capsule (100 mg total) by mouth 2 (two) times daily as needed for constipation. 02/27/13   Raeford Razor, MD  HYDROcodone-acetaminophen (NORCO/VICODIN) 5-325 MG per tablet Take 1-2  tablets by mouth every 6 (six) hours as needed for pain. 03/05/13   Naiping Glee Arvin, MD  OVER THE COUNTER MEDICATION Take 2 tablets by mouth every 4 (four) hours as needed (sinus congestion). [employer] sinus medication    Historical Provider, MD  OVER THE COUNTER MEDICATION Take 2 tablets by mouth as needed (for constipation). [employer] laxative    Historical Provider, MD  oxyCODONE (OXY IR/ROXICODONE) 5 MG immediate release tablet Take 1-3 tablets (5-15 mg total) by mouth every 4 (four) hours as needed for pain. 03/05/13   Naiping Glee Arvin, MD  promethazine (PHENERGAN) 25 MG tablet Take 1 tablet (25 mg total) by mouth every 6 (six) hours as needed for nausea. 03/05/13   Naiping Glee Arvin, MD   BP 104/58 mmHg  Pulse 101  Temp(Src) 99.3 F (37.4 C) (Oral)  Resp 23  SpO2 94% Physical Exam  Constitutional: She is oriented to person, place,  and time. She appears well-developed and well-nourished.  HENT:  Head: Normocephalic and atraumatic.  Mouth/Throat: Oropharynx is clear and moist.  Eyes: Pupils are equal, round, and reactive to light.  Neck: Normal range of motion. Neck supple.  Cardiovascular: Normal rate, regular rhythm and normal heart sounds.   Pulmonary/Chest: Effort normal and breath sounds normal. No respiratory distress. She has no wheezes. She has no rales. She exhibits no tenderness.  Abdominal: Soft. Bowel sounds are normal. There is no tenderness. There is no rebound and no guarding.  Musculoskeletal: Normal range of motion. She exhibits no edema.  Lymphadenopathy:    She has no cervical adenopathy.  Neurological: She is alert and oriented to person, place, and time.  Cranial nerves II through XII grossly intact. Motor strength in her upper extremities are 4 out of 5 bilaterally. Motor strength in her lower extremities are 3 out of 5 bilaterally. She has numbness to light touch in her lower extremities which is unchanged from her baseline. She has normal sensation to  light touch in her upper extremities. Finger to nose is delayed but she says this is normal with her Parkinson's  Skin: Skin is warm and dry. No rash noted.  Psychiatric: She has a normal mood and affect.    ED Course  Procedures (including critical care time) Labs Review Labs Reviewed  COMPREHENSIVE METABOLIC PANEL - Abnormal; Notable for the following:    Sodium 136 (*)    Potassium 3.6 (*)    Glucose, Bld 115 (*)    Creatinine, Ser 0.45 (*)    Albumin 2.9 (*)    All other components within normal limits  CBC WITH DIFFERENTIAL - Abnormal; Notable for the following:    WBC 3.6 (*)    RBC 2.95 (*)    Hemoglobin 9.3 (*)    HCT 27.8 (*)    Platelets 557 (*)    Eosinophils Relative 7 (*)    All other components within normal limits  URINALYSIS, ROUTINE W REFLEX MICROSCOPIC - Abnormal; Notable for the following:    APPearance CLOUDY (*)    Nitrite POSITIVE (*)    Leukocytes, UA MODERATE (*)    All other components within normal limits  URINE MICROSCOPIC-ADD ON - Abnormal; Notable for the following:    Squamous Epithelial / LPF MANY (*)    Bacteria, UA MANY (*)    All other components within normal limits  URINE CULTURE  CULTURE, EXPECTORATED SPUTUM-ASSESSMENT  HIV ANTIBODY (ROUTINE TESTING)  POC OCCULT BLOOD, ED    Imaging Review Dg Chest 2 View  05/09/2014   CLINICAL DATA:  Weakness, shortness of breath, cough.  EXAM: CHEST  2 VIEW  COMPARISON:  03/05/2013  FINDINGS: Multiple monitoring leads overlie the patient. Stable cardiac and mediastinal contours given differences in patient rotation. New focal heterogeneous opacity within the right lung base. No pleural effusion or pneumothorax. Mid thoracic spine degenerative change.  IMPRESSION: New focal heterogeneous opacity within the peripheral right lower lung, potentially infectious in etiology. Underlying mass could have a similar appearance. Recommend short term radiographic followup to assess for resolution.   Electronically  Signed   By: Annia Beltrew  Davis M.D.   On: 05/09/2014 17:35   Ct Head Wo Contrast  05/09/2014   CLINICAL DATA:  Week and slurred speech.  EXAM: CT HEAD WITHOUT CONTRAST  TECHNIQUE: Contiguous axial images were obtained from the base of the skull through the vertex without intravenous contrast.  COMPARISON:  None.  FINDINGS: Ventricles and sulci are appropriate  for patient's age. No evidence for acute cortically based infarct, intracranial hemorrhage, mass lesion or mass effect. Periventricular subcortical white matter hypodensity compatible with chronic small vessel ischemic change. Orbits are unremarkable. Mastoid air cells are well aerated. Mucosal thickening involving sphenoid sinus ethmoid air cells. Calvarium is intact.  IMPRESSION: No acute intracranial process.  Sphenoid sinus and ethmoid air cell mucosal thickening.   Electronically Signed   By: Annia Beltrew  Davis M.D.   On: 05/09/2014 17:39   Ct Chest W Contrast  05/09/2014   CLINICAL DATA:  Bilateral lower leg weakness.  Slurred speech.  EXAM: CT CHEST WITH CONTRAST  TECHNIQUE: Multidetector CT imaging of the chest was performed during intravenous contrast administration.  CONTRAST:  80mL OMNIPAQUE IOHEXOL 300 MG/ML  SOLN  COMPARISON:  Earlier same date chest radiograph  FINDINGS: Visualized neck base is unremarkable. No enlarged axillary lymphadenopathy. Prominent right hilar lymph node measuring 0.8 cm. Normal heart size. No pericardial effusion. Calcified atherosclerotic plaque involving the coronary arteries.  Central airways are patent. There are tree-in-bud nodular opacities and associated ground-glass opacities within the subpleural right lower lobe. There are additional scattered tree-in-bud opacities within the peripheral right middle lobe, right upper lobe, left upper lobe and left lower lobes. No pleural effusion or pneumothorax.  Visualization of the upper abdomen demonstrates fatty deposition adjacent to the falciform ligament. Post cholecystectomy.  Bilateral low-attenuation renal lesions, too small to accurately characterize.  Mid thoracic spine degenerative change.  IMPRESSION: Right lower lobe consolidative opacity with associated tree-in-bud nodular opacities within the right lower, left lower, left upper, right upper and right middle lobes. Overall findings are suggestive of an airway disseminated infectious process. Recommend short term radiographic followup to ensure resolution.  Prominent right hilar lymph node, likely reactive in etiology.   Electronically Signed   By: Annia Beltrew  Davis M.D.   On: 05/09/2014 20:27     EKG Interpretation   Date/Time:  Saturday May 09 2014 16:02:04 EST Ventricular Rate:  112 PR Interval:  157 QRS Duration: 94 QT Interval:  336 QTC Calculation: 459 R Axis:   60 Text Interpretation:  Sinus tachycardia Minimal ST depression, inferior  leads No old tracing to compare Confirmed by Tyishia Aune  MD, Jamaiyah Pyle ([id number]) on  05/09/2014 5:01:55 PM      MDM   Final diagnoses:  Community acquired pneumonia  UTI (lower urinary tract infection)    CXR shows an atypical opacity in the R lung.  I got a chest CT to better characterize this. The chest CT shows what appears to be disseminated infectious process. There is a tree-in-bud pattern which I spoke to the radiologist about and he says that this typically represents a more atypical type infection. It could still represent a bacterial infection versus a fungal infection versus other type of infectious process but is usually more than a typical type infection. Patient was given Rocephin and Zithromax. A sputum culture was obtained. She does have evidence of a urinary tract infection which will be covered by the Rocephin. Urine cultures were obtained as well. Given her neutropenia I also added an HIV test. She does have anemia as well but her Hemoccult was negative. Her oxygen saturations are normal. Her blood pressure is stable. I will consult the hospitalist for  admission.    Rolan BuccoMelanie Emonnie Cannady, MD 05/09/14 2155

## 2014-05-09 NOTE — ED Notes (Signed)
Pt from home, pt report weakness on both legs. Last time seen normal 1545. Son in law at bedside and reports slurred speech and weakness started 1545. No further neuro deficit. Pt alert and oriented to time, person and situation. No Hx of HTN or Cardiac. Hx of Parkinson's.

## 2014-05-09 NOTE — H&P (Signed)
PCP: none originally from [place]   Chief Complaint:  Cough, shortness of breath poor appettite  HPI: Angel Bass is a 68 y.o. female   has a past medical history of Parkinson disease; Thyroid disease; UTI (urinary tract infection); Fractured elbow; Hypothyroidism; Anxiety; Shortness of breath; Constipation; Arthritis; and Macular degeneration disease.   Presented with  3 week hx of Cough productive of brown green sputum thick sputum sometimes foamy. It was worse after eating. She developed nausea after eating and started to vomit. She has no appetite and started to loose weight. Patient lost 18 lb. Patient stopped smoking 6 weeks ago. She deneis any fever. Today she had an episode of transient confusion and bright light sensation  in her eyes, she transiently had hard time finding words. Son in law noted some mumbling, her jaw treambled.  this lasted for a few seconds. She has hx of TIA in the past but reports nothing like that. No LOC. family was worried and [facility].  On arrival to ER CT scan of the head was unremarkable but CT chest  showed Right lower lobe consolidative opacity with associated tree-in-bud nodular opacities within the right lower, left lower, left upper, right upper and right middle lobes. Patient felt she had a UTI and called her regular doctor in [place] has started her on Bactrim for the past 3 days.  UA was noted for UTI Hospitalist was called for admission for atypical PNA and UTI  Review of Systems:    Pertinent positives include: fatigue, weight loss, nausea, vomiting,  excess mucus,  shortness of breath at  Rest. productive cough,   Constitutional:  No weight loss, night sweats, Fevers, chills,  HEENT:  No headaches, Difficulty swallowing,Tooth/dental problems,Sore throat,  No sneezing, itching, ear ache, nasal congestion, post nasal drip,  Cardio-vascular:  No chest pain, Orthopnea, PND, anasarca, dizziness, palpitations.no Bilateral lower  extremity swelling  GI:  No heartburn, indigestion, abdominal pain, diarrhea, change in bowel habits, loss of appetite, melena, blood in stool, hematemesis Resp:  no  No dyspnea on exertion,  No non-productive cough, No coughing up of blood.No change in color of mucus.No wheezing. Skin:  no rash or lesions. No jaundice GU:  no dysuria, change in color of urine, no urgency or frequency. No straining to urinate.  No flank pain.  Musculoskeletal:  No joint pain or no joint swelling. No decreased range of motion. No back pain.  Psych:  No change in mood or affect. No depression or anxiety. No memory loss.  Neuro: no localizing neurological complaints, no tingling, no weakness, no double vision, no gait abnormality, no slurred speech, no confusion  Otherwise ROS are negative except for above, 10 systems were reviewed  Past Medical History: Past Medical History  Diagnosis Date  . Parkinson disease   . Thyroid disease   . UTI (urinary tract infection)   . Fractured elbow   . Hypothyroidism   . Anxiety   . Shortness of breath     with exertion  . Constipation   . Arthritis   . Macular degeneration disease    Past Surgical History  Procedure Laterality Date  . Left elbow surgery    . Trigger finger x 5       thumb- both,   . Carpel tunnel Right   . Cholecystectomy    . Shoulder surgery for spur removal Right   . Open reduction internal fixation (orif) distal radial fracture Right 03/05/2013    Procedure:  OPEN REDUCTION INTERNAL FIXATION (ORIF) RIGHT DISTAL RADIUS FRACTURE;  Surgeon: Cheral Almas, MD;  Location: [facility] OR;  Service: Orthopedics;  Laterality: Right;     Medications: Prior to Admission medications   Medication Sig Start Date End Date Taking? Authorizing Provider  atorvastatin (LIPITOR) 10 MG tablet Take 10 mg by mouth daily.   Yes Historical Provider, MD  calcium carbonate (OS-CAL) 600 MG TABS tablet Take 600 mg by mouth 2 (two) times daily with a meal.   Yes  Historical Provider, MD  carbidopa-levodopa (SINEMET IR) 25-100 MG per tablet Take 3 tablets by mouth 4 (four) times daily. Take 3 Tablets at 7:30 am, Take 3 Tablets at 11:30 am, Take 3 Tablets at 2:30 pm & Take 3 Tablets at 5:30 pm.   Yes Historical Provider, MD  Cyanocobalamin (VITAMIN B 12 PO) Take 1 tablet by mouth daily.   Yes Historical Provider, MD  ibuprofen (ADVIL,MOTRIN) 200 MG tablet Take 400 mg by mouth daily.    Yes Historical Provider, MD  levothyroxine (SYNTHROID, LEVOTHROID) 100 MCG tablet Take 100 mcg by mouth daily before breakfast.   Yes Historical Provider, MD  Multiple Vitamins-Minerals (ICAPS PO) Take 1 tablet by mouth daily.   Yes Historical Provider, MD  Probiotic Product (PROBIOTIC DAILY PO) Take 1 capsule by mouth daily.   Yes Historical Provider, MD  sulfamethoxazole-trimethoprim (BACTRIM DS,SEPTRA DS) 800-160 MG per tablet Take 1 tablet by mouth daily. For 7 Days.   Yes Historical Provider, MD  traMADol (ULTRAM) 50 MG tablet Take 50 mg by mouth 3 (three) times daily as needed for moderate pain or severe pain (pain).   Yes Historical Provider, MD  ALPRAZolam Prudy Feeler) 0.25 MG tablet Take 0.25 mg by mouth at bedtime as needed for sleep.     Historical Provider, MD  calcium-vitamin D (OSCAL WITH D) 500-200 MG-UNIT per tablet Take 1 tablet by mouth daily with breakfast. Patient not taking: Reported on 05/09/2014 03/05/13   Naiping Glee Arvin, MD  celecoxib (CELEBREX) 200 MG capsule Take 1 capsule (200 mg total) by mouth 2 (two) times daily. Patient not taking: Reported on 05/09/2014 03/05/13   Naiping Glee Arvin, MD  docusate sodium (COLACE) 100 MG capsule Take 1 capsule (100 mg total) by mouth 2 (two) times daily as needed for constipation. 02/27/13   Raeford Razor, MD  HYDROcodone-acetaminophen (NORCO/VICODIN) 5-325 MG per tablet Take 1-2 tablets by mouth every 6 (six) hours as needed for pain. 03/05/13   Naiping Glee Arvin, MD  OVER THE COUNTER MEDICATION Take 2 tablets by  mouth every 4 (four) hours as needed (sinus congestion). [employer] sinus medication    Historical Provider, MD  OVER THE COUNTER MEDICATION Take 2 tablets by mouth as needed (for constipation). [employer] laxative    Historical Provider, MD    Allergies:  No Known Allergies  Social History:  Ambulatory wheelchair bound due to Parkinsons Lives at home   With family     reports that she has quit smoking. She does not have any smokeless tobacco history on file. She reports that she does not drink alcohol or use illicit drugs.    Family History: family history includes CAD in her other; Depression in her other.    Physical Exam: Patient Vitals for the past 24 hrs:  BP Temp Temp src Pulse Resp SpO2  05/09/14 2129 104/58 mmHg - - 101 23 94 %  05/09/14 1604 147/66 mmHg 99.3 F (37.4 C) Oral 114 22 96 %  1. General:  in No Acute distress 2. Psychological: Alert and Oriented 3. Head/ENT:   Dry Mucous Membranes                          Head Non traumatic, neck supple                          Normal  Dentition 4. SKIN:   decreased Skin turgor,  Skin clean Dry and intact no rash 5. Heart: Regular rate and rhythm no Murmur, Rub or gallop 6. Lungs: no wheezes some crackles  diminished air sounds at the bases 7. Abdomen: Soft, non-tender, Non distended 8. Lower extremities: no clubbing, cyanosis, or edema 9. Neurologically diminished strength bilaterally in lower extremities which is chronic upper extremities appear to be intact 10. MSK: Normal range of motion  body mass index is unknown because there is no weight on file.   Labs on Admission:   Results for orders placed or performed during the hospital encounter of 05/09/14 (from the past 24 hour(s))  Comprehensive metabolic panel     Status: Abnormal   Collection Time: 05/09/14  4:35 PM  Result Value Ref Range   Sodium 136 (L) 137 - 147 mEq/L   Potassium 3.6 (L) 3.7 - 5.3 mEq/L   Chloride 97 96 - 112 mEq/L   CO2 25 19 -  32 mEq/L   Glucose, Bld 115 (H) 70 - 99 mg/dL   BUN 11 6 - 23 mg/dL   Creatinine, Ser 9.60 (L) 0.50 - 1.10 mg/dL   Calcium 8.9 8.4 - 45.4 mg/dL   Total Protein 6.3 6.0 - 8.3 g/dL   Albumin 2.9 (L) 3.5 - 5.2 g/dL   AST <5 0 - 37 U/L   ALT <5 0 - 35 U/L   Alkaline Phosphatase 110 39 - 117 U/L   Total Bilirubin 0.4 0.3 - 1.2 mg/dL   GFR calc non Af Amer >90 >90 mL/min   GFR calc Af Amer >90 >90 mL/min   Anion gap 14 5 - 15  CBC with Differential     Status: Abnormal   Collection Time: 05/09/14  4:35 PM  Result Value Ref Range   WBC 3.6 (L) 4.0 - 10.5 K/uL   RBC 2.95 (L) 3.87 - 5.11 MIL/uL   Hemoglobin 9.3 (L) 12.0 - 15.0 g/dL   HCT 09.8 (L) 11.9 - 14.7 %   MCV 94.2 78.0 - 100.0 fL   MCH 31.5 26.0 - 34.0 pg   MCHC 33.5 30.0 - 36.0 g/dL   RDW 82.9 56.2 - 13.0 %   Platelets 557 (H) 150 - 400 K/uL   Neutrophils Relative % 70 43 - 77 %   Neutro Abs 2.5 1.7 - 7.7 K/uL   Lymphocytes Relative 18 12 - 46 %   Lymphs Abs 0.7 0.7 - 4.0 K/uL   Monocytes Relative 5 3 - 12 %   Monocytes Absolute 0.2 0.1 - 1.0 K/uL   Eosinophils Relative 7 (H) 0 - 5 %   Eosinophils Absolute 0.3 0.0 - 0.7 K/uL   Basophils Relative 0 0 - 1 %   Basophils Absolute 0.0 0.0 - 0.1 K/uL  Urinalysis, Routine w reflex microscopic     Status: Abnormal   Collection Time: 05/09/14  5:50 PM  Result Value Ref Range   Color, Urine YELLOW YELLOW   APPearance CLOUDY (A) CLEAR   Specific Gravity, Urine 1.020 1.005 -  1.030   pH 7.0 5.0 - 8.0   Glucose, UA NEGATIVE NEGATIVE mg/dL   Hgb urine dipstick NEGATIVE NEGATIVE   Bilirubin Urine NEGATIVE NEGATIVE   Ketones, ur NEGATIVE NEGATIVE mg/dL   Protein, ur NEGATIVE NEGATIVE mg/dL   Urobilinogen, UA 0.2 0.0 - 1.0 mg/dL   Nitrite POSITIVE (A) NEGATIVE   Leukocytes, UA MODERATE (A) NEGATIVE  Urine microscopic-add on     Status: Abnormal   Collection Time: 05/09/14  5:50 PM  Result Value Ref Range   Squamous Epithelial / LPF MANY (A) RARE   WBC, UA 3-6 <3 WBC/hpf    Bacteria, UA MANY (A) RARE  POC occult blood, ED RN will collect     Status: None   Collection Time: 05/09/14  5:53 PM  Result Value Ref Range   Fecal Occult Bld NEGATIVE NEGATIVE    UA evidence of UTI  No results found for: HGBA1C  CrCl cannot be calculated (Unknown ideal weight.).  BNP (last 3 results) No results for input(s): PROBNP in the last 8760 hours.  Other results:  I have pearsonaly reviewed this: ECG REPORT  Rate: 112  Rhythm: ST ST&T Change: no ischemia   There were no vitals filed for this visit.   Cultures: No results found for: SDES, SPECREQUEST, CULT, REPTSTATUS   Radiological Exams on Admission: Dg Chest 2 View  05/09/2014   CLINICAL DATA:  Weakness, shortness of breath, cough.  EXAM: CHEST  2 VIEW  COMPARISON:  03/05/2013  FINDINGS: Multiple monitoring leads overlie the patient. Stable cardiac and mediastinal contours given differences in patient rotation. New focal heterogeneous opacity within the right lung base. No pleural effusion or pneumothorax. Mid thoracic spine degenerative change.  IMPRESSION: New focal heterogeneous opacity within the peripheral right lower lung, potentially infectious in etiology. Underlying mass could have a similar appearance. Recommend short term radiographic followup to assess for resolution.   Electronically Signed   By: Annia Belt M.D.   On: 05/09/2014 17:35   Ct Head Wo Contrast  05/09/2014   CLINICAL DATA:  Week and slurred speech.  EXAM: CT HEAD WITHOUT CONTRAST  TECHNIQUE: Contiguous axial images were obtained from the base of the skull through the vertex without intravenous contrast.  COMPARISON:  None.  FINDINGS: Ventricles and sulci are appropriate for patient's age. No evidence for acute cortically based infarct, intracranial hemorrhage, mass lesion or mass effect. Periventricular subcortical white matter hypodensity compatible with chronic small vessel ischemic change. Orbits are unremarkable. Mastoid air cells are  well aerated. Mucosal thickening involving sphenoid sinus ethmoid air cells. Calvarium is intact.  IMPRESSION: No acute intracranial process.  Sphenoid sinus and ethmoid air cell mucosal thickening.   Electronically Signed   By: Annia Belt M.D.   On: 05/09/2014 17:39   Ct Chest W Contrast  05/09/2014   CLINICAL DATA:  Bilateral lower leg weakness.  Slurred speech.  EXAM: CT CHEST WITH CONTRAST  TECHNIQUE: Multidetector CT imaging of the chest was performed during intravenous contrast administration.  CONTRAST:  80mL OMNIPAQUE IOHEXOL 300 MG/ML  SOLN  COMPARISON:  Earlier same date chest radiograph  FINDINGS: Visualized neck base is unremarkable. No enlarged axillary lymphadenopathy. Prominent right hilar lymph node measuring 0.8 cm. Normal heart size. No pericardial effusion. Calcified atherosclerotic plaque involving the coronary arteries.  Central airways are patent. There are tree-in-bud nodular opacities and associated ground-glass opacities within the subpleural right lower lobe. There are additional scattered tree-in-bud opacities within the peripheral right middle lobe, right upper lobe, left  upper lobe and left lower lobes. No pleural effusion or pneumothorax.  Visualization of the upper abdomen demonstrates fatty deposition adjacent to the falciform ligament. Post cholecystectomy. Bilateral low-attenuation renal lesions, too small to accurately characterize.  Mid thoracic spine degenerative change.  IMPRESSION: Right lower lobe consolidative opacity with associated tree-in-bud nodular opacities within the right lower, left lower, left upper, right upper and right middle lobes. Overall findings are suggestive of an airway disseminated infectious process. Recommend short term radiographic followup to ensure resolution.  Prominent right hilar lymph node, likely reactive in etiology.   Electronically Signed   By: Annia Beltrew  Davis M.D.   On: 05/09/2014 20:27    Chart has been  reviewed  Assessment/Plan  68 year old female history of Parkinson disease currently wheelchair bound, presents with cough and loss of weight over the past 3 weeks CT scan was worrisome for atypical pneumonia, She developed transient slurred speech lasting only 5 seconds in the setting of UTI  and ongoing infection  Present on Admission:  . CAP (community acquired pneumonia) vs aspiration PNA given history of worsening cough and after she eating  - given her typical pattern on CT scan. We'll cover broadly with Zosyn and azithromycin. Spoke to pulmonology who will see patient in consult for evaluation of atypical infectious process, we'll set sputum for MAC  . Parkinson disease-continue home medications   . Anemia - obtain anemia panel and Hemoccult stool  . UTI (lower urinary tract infection) - should cover with Zosyn await result urine culture  . Hypokalemia - replace  . Slurred speech -  Atypical finding in the setting of ongoing infection, we will obtain MRI for baseline if there is any evidence of abnormality neurology consult can be called this was discussed with Dr. Cyril Mourningamillo    Prophylaxis: SCD , Protonix  CODE STATUS:  DNR/DNI  Other plan as per orders.  I have spent a total of 75 min on this admission extra time was taken to discuss case with pulmonology and neurology   Daisie Haft 05/09/2014, 10:36 PM  [facility]  Pager [phone]   after 2 AM please page floor coverage PA If 7AM-7PM, please contact the day team taking care of the patient  [email or web address]  Password [email or web address]

## 2014-05-10 ENCOUNTER — Inpatient Hospital Stay (HOSPITAL_COMMUNITY): Payer: Medicare HMO

## 2014-05-10 DIAGNOSIS — N3090 Cystitis, unspecified without hematuria: Secondary | ICD-10-CM

## 2014-05-10 DIAGNOSIS — R1311 Dysphagia, oral phase: Secondary | ICD-10-CM

## 2014-05-10 LAB — HIV ANTIBODY (ROUTINE TESTING W REFLEX): HIV: NONREACTIVE

## 2014-05-10 LAB — COMPREHENSIVE METABOLIC PANEL
AST: <5 U/L (ref 0–37)
Albumin: 2.5 g/dL — ABNORMAL LOW (ref 3.5–5.2)
Alkaline Phosphatase: 101 U/L (ref 39–117)
Anion gap: 9 (ref 5–15)
BUN: 7 mg/dL (ref 6–23)
CO2: 25 mEq/L (ref 19–32)
Calcium: 8.1 mg/dL — ABNORMAL LOW (ref 8.4–10.5)
Chloride: 104 mEq/L (ref 96–112)
Creatinine, Ser: 0.56 mg/dL (ref 0.50–1.10)
GFR calc Af Amer: >90 mL/min (ref >90)
GFR calc non Af Amer: >90 mL/min (ref >90)
Glucose, Bld: 117 mg/dL — ABNORMAL HIGH (ref 70–99)
Potassium: 3.9 mEq/L (ref 3.7–5.3)
SODIUM: 138 meq/L (ref 137–147)
TOTAL PROTEIN: 5.5 g/dL — AB (ref 6.0–8.3)
Total Bilirubin: 0.3 mg/dL (ref 0.3–1.2)

## 2014-05-10 LAB — CBC WITH DIFFERENTIAL/PLATELET
Basophils Absolute: 0 10*3/uL (ref 0.0–0.1)
Basophils Relative: 0 % (ref 0–1)
EOS ABS: 0.4 10*3/uL (ref 0.0–0.7)
Eosinophils Relative: 11 % — ABNORMAL HIGH (ref 0–5)
HCT: 24.6 % — ABNORMAL LOW (ref 36.0–46.0)
HEMOGLOBIN: 8.3 g/dL — AB (ref 12.0–15.0)
LYMPHS ABS: 0.7 10*3/uL (ref 0.7–4.0)
Lymphocytes Relative: 22 % (ref 12–46)
MCH: 32 pg (ref 26.0–34.0)
MCHC: 33.7 g/dL (ref 30.0–36.0)
MCV: 95 fL (ref 78.0–100.0)
MONOS PCT: 6 % (ref 3–12)
Monocytes Absolute: 0.2 10*3/uL (ref 0.1–1.0)
NEUTROS PCT: 61 % (ref 43–77)
Neutro Abs: 1.9 10*3/uL (ref 1.7–7.7)
Platelets: 495 10*3/uL — ABNORMAL HIGH (ref 150–400)
RBC: 2.59 MIL/uL — ABNORMAL LOW (ref 3.87–5.11)
RDW: 13.5 % (ref 11.5–15.5)
WBC: 3.1 10*3/uL — ABNORMAL LOW (ref 4.0–10.5)

## 2014-05-10 LAB — IRON AND TIBC
Iron: 127 ug/dL (ref 42–135)
SATURATION RATIOS: 55 % (ref 20–55)
TIBC: 233 ug/dL — AB (ref 250–470)
UIBC: 106 ug/dL — AB (ref 125–400)

## 2014-05-10 LAB — EXPECTORATED SPUTUM ASSESSMENT W GRAM STAIN, RFLX TO RESP C: Special Requests: NORMAL

## 2014-05-10 LAB — RETICULOCYTES
RBC.: 2.59 MIL/uL — ABNORMAL LOW (ref 3.87–5.11)
Retic Ct Pct: <0.4 % — ABNORMAL LOW (ref 0.4–3.1)

## 2014-05-10 LAB — BLOOD GAS, ARTERIAL
Acid-base deficit: 2 mmol/L (ref 0.0–2.0)
Bicarbonate: 21 mEq/L (ref 20.0–24.0)
Drawn by: 11249
FIO2: 0.21 %
O2 Saturation: 91 %
Patient temperature: 97.7
TCO2: 18.9 mmol/L (ref 0–100)
pCO2 arterial: 31.3 mmHg — ABNORMAL LOW (ref 35.0–45.0)
pH, Arterial: 7.44 (ref 7.350–7.450)
pO2, Arterial: 59.6 mmHg — ABNORMAL LOW (ref 80.0–100.0)

## 2014-05-10 LAB — VITAMIN B12: Vitamin B-12: 1061 pg/mL — ABNORMAL HIGH (ref 211–911)

## 2014-05-10 LAB — EXPECTORATED SPUTUM ASSESSMENT W REFEX TO RESP CULTURE

## 2014-05-10 LAB — TROPONIN I: Troponin I: <0.3 ng/mL (ref <0.30)

## 2014-05-10 LAB — GLUCOSE, CAPILLARY: Glucose-Capillary: 126 mg/dL — ABNORMAL HIGH (ref 70–99)

## 2014-05-10 LAB — PREALBUMIN: Prealbumin: 15.5 mg/dL — ABNORMAL LOW (ref 17.0–34.0)

## 2014-05-10 LAB — STREP PNEUMONIAE URINARY ANTIGEN: Strep Pneumo Urinary Antigen: NEGATIVE

## 2014-05-10 LAB — FOLATE: FOLATE: 3.2 ng/mL — AB

## 2014-05-10 LAB — FERRITIN: Ferritin: 405 ng/mL — ABNORMAL HIGH (ref 10–291)

## 2014-05-10 MED ORDER — SODIUM CHLORIDE 0.9 % IV SOLN
3.0000 g | Freq: Four times a day (QID) | INTRAVENOUS | Status: DC
  Administered 2014-05-10 – 2014-05-12 (×10): 3 g via INTRAVENOUS
  Filled 2014-05-10 (×11): qty 3

## 2014-05-10 MED ORDER — LEVETIRACETAM IN NACL 500 MG/100ML IV SOLN
500.0000 mg | Freq: Two times a day (BID) | INTRAVENOUS | Status: DC
  Administered 2014-05-11 – 2014-05-12 (×3): 500 mg via INTRAVENOUS
  Filled 2014-05-10 (×4): qty 100

## 2014-05-10 MED ORDER — LEVETIRACETAM IN NACL 1000 MG/100ML IV SOLN
1000.0000 mg | Freq: Once | INTRAVENOUS | Status: AC
  Administered 2014-05-10: 1000 mg via INTRAVENOUS
  Filled 2014-05-10 (×2): qty 100

## 2014-05-10 NOTE — Progress Notes (Signed)
ANTIBIOTIC CONSULT NOTE - INITIAL  Pharmacy Consult for Unasyn Indication: Aspiration Pneumonia  No Known Allergies  Patient Measurements: Height: 5\' 6"  (167.6 cm) Weight: 144 lb 13.5 oz (65.7 kg) IBW/kg (Calculated) : 59.3  Vital Signs: Temp: 98.3 F (36.8 C) (12/20 0350) Temp Source: Oral (12/20 0350) BP: 115/53 mmHg (12/20 0350) Pulse Rate: 99 (12/20 0350) Intake/Output from previous day: 12/19 0701 - 12/20 0700 In: 120 [P.O.:120] Out: 400 [Urine:400] Intake/Output from this shift: Total I/O In: 120 [P.O.:120] Out: 400 [Urine:400]  Labs:  Recent Labs  05/09/14 1635  WBC 3.6*  HGB 9.3*  PLT 557*  CREATININE 0.45*   Estimated Creatinine Clearance: 63 mL/min (by C-G formula based on Cr of 0.45). No results for input(s): VANCOTROUGH, VANCOPEAK, VANCORANDOM, GENTTROUGH, GENTPEAK, GENTRANDOM, TOBRATROUGH, TOBRAPEAK, TOBRARND, AMIKACINPEAK, AMIKACINTROU, AMIKACIN in the last 72 hours.   Microbiology: No results found for this or any previous visit (from the past 720 hour(s)).  Medical History: Past Medical History  Diagnosis Date  . Parkinson disease   . Thyroid disease   . UTI (urinary tract infection)   . Fractured elbow   . Hypothyroidism   . Anxiety   . Shortness of breath     with exertion  . Constipation   . Arthritis   . Macular degeneration disease     Medications:  Scheduled:  . ampicillin-sulbactam (UNASYN) IV  3 g Intravenous Q6H  . atorvastatin  10 mg Oral Daily  . azithromycin  500 mg Intravenous Q24H  . carbidopa-levodopa  3 tablet Oral 4 times per day  . dextrose 5 % and 0.45% NaCl   Intravenous STAT  . feeding supplement (ENSURE COMPLETE)  237 mL Oral BID BM  . levothyroxine  100 mcg Oral QAC breakfast   Infusions:  . sodium chloride 100 mL/hr at 05/10/14 0031   Assessment:  [age] yr female with complaints of SOB, cough and poor appetite.  Recently stopped smoking  CT chest suggestive of disseminated infection  Rocephin 1gm &  Azithromycin 500mg  IV x 1 given in ED  Upon admission, Azithromycin continued and Zosyn added  Pulmonology consulted who recommended continuing Azithromycin for atypical coverage and changing Zosyn to Unasyn (with pharmacy consulted to dose)  Blood, urine, sputum and respiratory virus cultures ordered  Goal of Therapy:  Eradication of infection  Plan:  Follow up culture results  Unasyn 3gm IV q6h  Liticia Gasior, Joselyn GlassmanLeann Trefz, PharmD 05/10/2014,5:43 AM

## 2014-05-10 NOTE — Progress Notes (Addendum)
[facility] PROGRESS NOTE  Angel Bass UEA:[id number] DOB: 04/26/1946 DOA: 05/09/2014 PCP: No primary care provider on file.   Brief narrative 68 year old female with history of Parkinson disease, hypothyroidism, arthritis and macular degeneration [place] presented with 2-3 weeks of cough with productive thick brown-green sputum. She also had episode of nausea and vomiting. Reports poor appetite with weight loss. She has a history of smoking which she quit 6 weeks back. While [place] from the airport to the [place] she had transient slurred speech with an episode of light flashing in her eyes. Patient in the ED had a normal head CT but a chest CT showed right lower lobe consolidation associated with tree-in-bud nodular opacity in multiple lobes. Patient was also found to have UTI . She has been treated with Bactrim by her primary doctor for the past 3 days.  Assessment/Plan: Community acquired/atypical pneumonia Possibly atypical versus viral pneumonia with abnormal CT findings. Seen by pulmonary and switched antibiotics to Unasyn and azithromycin. Follow cultures including AFB, and respiratory viral panel, and urine Legionella antigen. Urine strep antigen negative. Patient remains afebrile. Supportive care with tylenol and antitussives. Patient will need follow-up x-ray in 4-6 weeks to see for resolution of the pneumonia.   Dysphagia  has mild oral phase dysphagia on swallow eval. Ordered MBS for tomorrow Recommend regular diet with thin liquid for now   Slurred speech Transient symptoms lasting for a few seconds. Possibly in the setting of underlying infection. No residual weakness or neurological deficit. Head CT unremarkable. Will hold off on further imaging or workup. Also cannot do MRI due to presence of metallic rods in her forearms from previous surgery.  Parkinson's disease Continue home medications  Anemia Check stool for occult blood  UTI Follow  urine culture. On Unasyn  Hypokalemia Replenish  DVT prophylaxis: Subcutaneous Lovenox  Diet: Regular with thin liquid   Code Status: DO NOT RESUSCITATE Family Communication: None at bedside Disposition Plan: Home in 1-2 days if improved   Consultants: Pulmonary   Procedures:  None  Antibiotics:  IV Zosyn 12/19  IV azithromycin 12/19   Unasyn 12/20  HPI/Subjective: Patient seen and examined. Reports her cough and breathing to be slightly better.  Objective: Filed Vitals:   05/10/14 1008  BP: 103/55  Pulse: 88  Temp: 98.7 F (37.1 C)  Resp: 20    Intake/Output Summary (Last 24 hours) at 05/10/14 1454 Last data filed at 05/10/14 1100  Gross per 24 hour  Intake    240 ml  Output    600 ml  Net   -360 ml   Filed Weights   05/09/14 2327  Weight: 65.7 kg (144 lb 13.5 oz)    Exam:   General: Elderly female in no acute distress  HEENT: No pallor, moist oral mucosa  Chest: Clear to auscultation bilaterally, fine crackles over right lung base, no rhonchi or wheeze  Cardiovascular: Normal S1 and S2, no murmurs  Abdomen: Soft, nondistended, nontender  Extremities: Warm, no edema  CNS: Alert and oriented  Data Reviewed: Basic Metabolic Panel:  Recent Labs Lab 05/09/14 1635 05/10/14 0605  NA 136* 138  K 3.6* 3.9  CL 97 104  CO2 25 25  GLUCOSE 115* 117*  BUN 11 7  CREATININE 0.45* 0.56  CALCIUM 8.9 8.1*   Liver Function Tests:  Recent Labs Lab 05/09/14 1635 05/10/14 0605  AST <5 <5  ALT <5 <5  ALKPHOS 110 101  BILITOT 0.4 0.3  PROT 6.3 5.5*  ALBUMIN  2.9* 2.5*   No results for input(s): LIPASE, AMYLASE in the last 168 hours. No results for input(s): AMMONIA in the last 168 hours. CBC:  Recent Labs Lab 05/09/14 1635 05/10/14 0605  WBC 3.6* 3.1*  NEUTROABS 2.5 1.9  HGB 9.3* 8.3*  HCT 27.8* 24.6*  MCV 94.2 95.0  PLT 557* 495*   Cardiac Enzymes:  Recent Labs Lab 05/10/14 0006 05/10/14 0606 05/10/14 1224  TROPONINI  <0.30 <0.30 <0.30   BNP (last 3 results) No results for input(s): PROBNP in the last 8760 hours. CBG: No results for input(s): GLUCAP in the last 168 hours.  Recent Results (from the past 240 hour(s))  Culture, expectorated sputum-assessment     Status: None   Collection Time: 05/10/14  6:31 AM  Result Value Ref Range Status   Specimen Description SPUTUM  Final   Special Requests Normal  Final   Sputum evaluation   Final    THIS SPECIMEN IS ACCEPTABLE. RESPIRATORY CULTURE REPORT TO FOLLOW.   Report Status 05/10/2014 FINAL  Final     Studies: Dg Chest 2 View  05/09/2014   CLINICAL DATA:  Weakness, shortness of breath, cough.  EXAM: CHEST  2 VIEW  COMPARISON:  03/05/2013  FINDINGS: Multiple monitoring leads overlie the patient. Stable cardiac and mediastinal contours given differences in patient rotation. New focal heterogeneous opacity within the right lung base. No pleural effusion or pneumothorax. Mid thoracic spine degenerative change.  IMPRESSION: New focal heterogeneous opacity within the peripheral right lower lung, potentially infectious in etiology. Underlying mass could have a similar appearance. Recommend short term radiographic followup to assess for resolution.   Electronically Signed   By: Annia Beltrew  Davis M.D.   On: 05/09/2014 17:35   Ct Head Wo Contrast  05/09/2014   CLINICAL DATA:  Week and slurred speech.  EXAM: CT HEAD WITHOUT CONTRAST  TECHNIQUE: Contiguous axial images were obtained from the base of the skull through the vertex without intravenous contrast.  COMPARISON:  None.  FINDINGS: Ventricles and sulci are appropriate for patient's age. No evidence for acute cortically based infarct, intracranial hemorrhage, mass lesion or mass effect. Periventricular subcortical white matter hypodensity compatible with chronic small vessel ischemic change. Orbits are unremarkable. Mastoid air cells are well aerated. Mucosal thickening involving sphenoid sinus ethmoid air cells. Calvarium  is intact.  IMPRESSION: No acute intracranial process.  Sphenoid sinus and ethmoid air cell mucosal thickening.   Electronically Signed   By: Annia Beltrew  Davis M.D.   On: 05/09/2014 17:39   Ct Chest W Contrast  05/09/2014   CLINICAL DATA:  Bilateral lower leg weakness.  Slurred speech.  EXAM: CT CHEST WITH CONTRAST  TECHNIQUE: Multidetector CT imaging of the chest was performed during intravenous contrast administration.  CONTRAST:  80mL OMNIPAQUE IOHEXOL 300 MG/ML  SOLN  COMPARISON:  Earlier same date chest radiograph  FINDINGS: Visualized neck base is unremarkable. No enlarged axillary lymphadenopathy. Prominent right hilar lymph node measuring 0.8 cm. Normal heart size. No pericardial effusion. Calcified atherosclerotic plaque involving the coronary arteries.  Central airways are patent. There are tree-in-bud nodular opacities and associated ground-glass opacities within the subpleural right lower lobe. There are additional scattered tree-in-bud opacities within the peripheral right middle lobe, right upper lobe, left upper lobe and left lower lobes. No pleural effusion or pneumothorax.  Visualization of the upper abdomen demonstrates fatty deposition adjacent to the falciform ligament. Post cholecystectomy. Bilateral low-attenuation renal lesions, too small to accurately characterize.  Mid thoracic spine degenerative change.  IMPRESSION: Right lower  lobe consolidative opacity with associated tree-in-bud nodular opacities within the right lower, left lower, left upper, right upper and right middle lobes. Overall findings are suggestive of an airway disseminated infectious process. Recommend short term radiographic followup to ensure resolution.  Prominent right hilar lymph node, likely reactive in etiology.   Electronically Signed   By: Annia Belt M.D.   On: 05/09/2014 20:27    Scheduled Meds: . ampicillin-sulbactam (UNASYN) IV  3 g Intravenous Q6H  . atorvastatin  10 mg Oral Daily  . azithromycin  500 mg  Intravenous Q24H  . carbidopa-levodopa  3 tablet Oral 4 times per day  . dextrose 5 % and 0.45% NaCl   Intravenous STAT  . feeding supplement (ENSURE COMPLETE)  237 mL Oral BID BM  . levothyroxine  100 mcg Oral QAC breakfast   Continuous Infusions:     Time spent: 25 minutes    Charlisa Cham  [facility] Pager [phone]. If 7PM-7AM, please contact night-coverage at [email or web address], password [facility] 05/10/2014, 2:54 PM  LOS: 1 day

## 2014-05-10 NOTE — Progress Notes (Signed)
[facility] progress note. Chief complaint. Seizure activity. History of present illness. This 68 year old female admitted when she had transient speech and lights flashing in her eyes while riding in a car. No prior history of seizure disorder. Currently being treated for pneumonia and UTI. Patient had an event overnight with speak in he came slurred to have word finding difficulty. Was followed by a period of rigidity and tonic-clonic motion lasting less than 2 minutes. During that event patient the patient became incontinent of urine. I came to see the patient at bedside along with rapid response. I found the patient initially lethargic and confused but this rapidly resolved. Patient did initially seem to have some word finding difficulty again resolved. Patient was taken to CT scan where CT scan of the brain no acute abnormality. Her blood sugar was well above 100. Arterial blood gas pH 7.440, PCO2 31.3, PO2 59.6, bicarbonate 21.0. Vital signs. 97.7, pulse 119, respirations 24, blood pressure 145/60. O2 sats 98%. General appearance. Frail elderly female who is alert and in no distress. Cardiac. Regular and mildly tachycardic. Lungs. Breath sounds are clear but reduced in the bases. Abdomen. Soft with positive bowel sounds. No pain. Neurologic. Cranial nerve XII grossly intact. No unilateral or focal defects. Impression/plan. Problem #1. Seizure activity. Description of family at bedside and staff sounds like new onset seizure activity. Discussed with neurology who recommended I order EEG and she patient on Keppra 1000 mg loading dose now and then 500 mg twice daily starting tomorrow. Neurology will see the patient in consult.

## 2014-05-10 NOTE — Progress Notes (Signed)
Pt has sudden onset  difficulty talking & seemed like having expressive aphasia. VSS, MD paged 2x. Awaiting to callback. Rapid response nurse was called to assess pt. MD was paged again, still awaiting to call back.

## 2014-05-10 NOTE — Evaluation (Signed)
Clinical/Bedside Swallow Evaluation Patient Details  Name: Angel BarterSusan Bass MRN: [id number] Date of Birth: 03/02/1946  Today's Date: 05/10/2014 Time: [id number] SLP Time Calculation (min) (ACUTE ONLY): 17 min  Past Medical History:  Past Medical History  Diagnosis Date  . Parkinson disease   . Thyroid disease   . UTI (urinary tract infection)   . Fractured elbow   . Hypothyroidism   . Anxiety   . Shortness of breath     with exertion  . Constipation   . Arthritis   . Macular degeneration disease    Past Surgical History:  Past Surgical History  Procedure Laterality Date  . Left elbow surgery    . Trigger finger x 5       thumb- both,   . Carpel tunnel Right   . Cholecystectomy    . Shoulder surgery for spur removal Right   . Open reduction internal fixation (orif) distal radial fracture Right 03/05/2013    Procedure: OPEN REDUCTION INTERNAL FIXATION (ORIF) RIGHT DISTAL RADIUS FRACTURE;  Surgeon: Cheral AlmasNaiping Michael Xu, MD;  Location: [facility] OR;  Service: Orthopedics;  Laterality: Right;   HPI:  68 yo female with PMH of parkinson's presents with brief episode of aphasia, also noted to have recent cough and CT chest concerning for infection (Right lower lobe consolidative opacity with associated tree-in-bud   Assessment / Plan / Recommendation Clinical Impression  Patient presents with a functional oropharyngeal swallow at bedside however long standing h/o dysphagia both prior to and after Parkinson's diagnosis, mild oral phase deficits, and acute CXR results warrant instrumental testing to more formally assess function. Educated patient on gneral safe swallowing precautions and recommendations for MBS 12/21. Patient in agreement. Will f/u 12.21 for MBS.     Aspiration Risk  Moderate    Diet Recommendation Regular;Thin liquid   Liquid Administration via: Cup;Straw Medication Administration: Whole meds with liquid Supervision: Patient able to self feed Compensations: Slow rate;Small  sips/bites Postural Changes and/or Swallow Maneuvers: Seated upright 90 degrees;Upright 30-60 min after meal    Other  Recommendations Recommended Consults: MBS Oral Care Recommendations: Oral care BID   Follow Up Recommendations   (TBD pending MBS)       Pertinent Vitals/Pain n/a        Swallow Study    General HPI: 68 yo female with PMH of parkinson's presents with brief episode of aphasia, also noted to have recent cough and CT chest concerning for infection (Right lower lobe consolidative opacity with associated tree-in-bud Type of Study: Bedside swallow evaluation Previous Swallow Assessment: reported possible MBS "years and years ago" Diet Prior to this Study: Regular;Thin liquids Temperature Spikes Noted: No Respiratory Status: Room air History of Recent Intubation: No Behavior/Cognition: Alert;Cooperative;Pleasant mood Oral Cavity - Dentition: Dentures, top;Missing dentition (missing bottom dentition) Self-Feeding Abilities: Able to feed self Patient Positioning: Upright in bed Baseline Vocal Quality: Low vocal intensity (glottal stops, pitch breaks) Volitional Cough: Strong Volitional Swallow: Able to elicit    Oral/Motor/Sensory Function Overall Oral Motor/Sensory Function: Other (comment) (mildly decreased coordination)   Ice Chips Ice chips: Not tested   Thin Liquid Thin Liquid: Within functional limits Presentation: Cup;Self Fed;Straw    Nectar Thick Nectar Thick Liquid: Not tested   Honey Thick Honey Thick Liquid: Not tested   Puree Puree: Not tested   Solid   GO   [name] MA, CCC-SLP [phone]  Solid: Impaired Presentation: Self Fed Oral Phase Impairments: Impaired anterior to posterior transit (mild delay with patient requesting consistent liquid wash)  [name] Meryl 05/10/2014,2:21 PM

## 2014-05-10 NOTE — Consult Note (Signed)
PULMONARY  / CRITICAL CARE MEDICINE CONSULTATION   Name: Angel BarterSusan Najera MRN: [id number] DOB: 01/13/1946    ADMISSION DATE:  05/09/2014 CONSULTATION DATE: 05/10/14  REQUESTING CLINICIAN: Dr. Rosamaria Lintsoutava PRIMARY SERVICE: [facility]  CHIEF COMPLAINT:  Cough, slurred speech  BRIEF PATIENT DESCRIPTION: [age]yof with PMH of parkinson's presents with brief episode of aphasia, also noted to have recent cough and CT chest concerning for infection.    SIGNIFICANT EVENTS / STUDIES:  05/09/14:  Admitted, CT Chest with diffuse tree in bud opacities diffusely and some ground glass in RLL  LINES / TUBES: None  CULTURES: Urine cx: 05/09/14--> NGTD  ANTIBIOTICS: Zosyn: 12/19--> Azithro: 12/19-->  HISTORY OF PRESENT ILLNESS:   68yof with PMH of parkinson's presents with brief episode of aphasia, found to have CT chest findings concerning for infection.  She reports that she has been in [place] x approx 4 weeks.  At approx the time that she came to the area, she developed a cough productive of brownish sputum as well as increased fatigue.  Fatigue is improved and cough is improving as well.  She has noted a decreased appetite over past few weeks.  On the evening of the 19th, she had a brief period of expressive aphasia prompting her to present to the ED.  At time of my exam, pt was feeling back to baseline.  Denies fevers, chills, chest pain.  Endorses increased SOB however, and some recent nausea and vomiting.  Also endorses that she frequently chokes on her food.  PAST MEDICAL HISTORY :  Past Medical History  Diagnosis Date  . Parkinson disease   . Thyroid disease   . UTI (urinary tract infection)   . Fractured elbow   . Hypothyroidism   . Anxiety   . Shortness of breath     with exertion  . Constipation   . Arthritis   . Macular degeneration disease    Past Surgical History  Procedure Laterality Date  . Left elbow surgery    . Trigger finger x 5       thumb- both,   . Carpel tunnel Right    . Cholecystectomy    . Shoulder surgery for spur removal Right   . Open reduction internal fixation (orif) distal radial fracture Right 03/05/2013    Procedure: OPEN REDUCTION INTERNAL FIXATION (ORIF) RIGHT DISTAL RADIUS FRACTURE;  Surgeon: Cheral AlmasNaiping Michael Xu, MD;  Location: [facility] OR;  Service: Orthopedics;  Laterality: Right;   Prior to Admission medications   Medication Sig Start Date End Date Taking? Authorizing Provider  atorvastatin (LIPITOR) 10 MG tablet Take 10 mg by mouth daily.   Yes Historical Provider, MD  calcium carbonate (OS-CAL) 600 MG TABS tablet Take 600 mg by mouth 2 (two) times daily with a meal.   Yes Historical Provider, MD  carbidopa-levodopa (SINEMET IR) 25-100 MG per tablet Take 3 tablets by mouth 4 (four) times daily. Take 3 Tablets at 7:30 am, Take 3 Tablets at 11:30 am, Take 3 Tablets at 2:30 pm & Take 3 Tablets at 5:30 pm.   Yes Historical Provider, MD  Cyanocobalamin (VITAMIN B 12 PO) Take 1 tablet by mouth daily.   Yes Historical Provider, MD  ibuprofen (ADVIL,MOTRIN) 200 MG tablet Take 400 mg by mouth daily.    Yes Historical Provider, MD  levothyroxine (SYNTHROID, LEVOTHROID) 100 MCG tablet Take 100 mcg by mouth daily before breakfast.   Yes Historical Provider, MD  Multiple Vitamins-Minerals (ICAPS PO) Take 1 tablet by mouth daily.   Yes Historical  Provider, MD  Probiotic Product (PROBIOTIC DAILY PO) Take 1 capsule by mouth daily.   Yes Historical Provider, MD  sulfamethoxazole-trimethoprim (BACTRIM DS,SEPTRA DS) 800-160 MG per tablet Take 1 tablet by mouth daily. For 7 Days.   Yes Historical Provider, MD  traMADol (ULTRAM) 50 MG tablet Take 50 mg by mouth 3 (three) times daily as needed for moderate pain or severe pain (pain).   Yes Historical Provider, MD  ALPRAZolam Prudy Feeler(XANAX) 0.25 MG tablet Take 0.25 mg by mouth at bedtime as needed for sleep.     Historical Provider, MD  calcium-vitamin D (OSCAL WITH D) 500-200 MG-UNIT per tablet Take 1 tablet by mouth daily with  breakfast. Patient not taking: Reported on 05/09/2014 03/05/13   Naiping Glee ArvinMichael Xu, MD  celecoxib (CELEBREX) 200 MG capsule Take 1 capsule (200 mg total) by mouth 2 (two) times daily. Patient not taking: Reported on 05/09/2014 03/05/13   Naiping Glee ArvinMichael Xu, MD  docusate sodium (COLACE) 100 MG capsule Take 1 capsule (100 mg total) by mouth 2 (two) times daily as needed for constipation. 02/27/13   Raeford RazorStephen Kohut, MD  HYDROcodone-acetaminophen (NORCO/VICODIN) 5-325 MG per tablet Take 1-2 tablets by mouth every 6 (six) hours as needed for pain. 03/05/13   Naiping Glee ArvinMichael Xu, MD  OVER THE COUNTER MEDICATION Take 2 tablets by mouth every 4 (four) hours as needed (sinus congestion). [employer] sinus medication    Historical Provider, MD  OVER THE COUNTER MEDICATION Take 2 tablets by mouth as needed (for constipation). [employer] laxative    Historical Provider, MD   No Known Allergies  FAMILY HISTORY:  Family History  Problem Relation Age of Onset  . CAD Other   . Depression Other    SOCIAL HISTORY:  reports that she has quit smoking. She does not have any smokeless tobacco history on file. She reports that she does not drink alcohol or use illicit drugs.  REVIEW OF SYSTEMS:  10 pts reviewed and neg except HPI  SUBJECTIVE: C/o SOB and cough  VITAL SIGNS: Temp:  [98.3 F (36.8 C)-99.3 F (37.4 C)] 98.3 F (36.8 C) (12/20 0350) Pulse Rate:  [97-114] 99 (12/20 0350) Resp:  [18-23] 20 (12/20 0350) BP: (104-147)/(53-66) 115/53 mmHg (12/20 0350) SpO2:  [94 %-96 %] 94 % (12/20 0350) Weight:  [144 lb 13.5 oz (65.7 kg)] 144 lb 13.5 oz (65.7 kg) (12/19 2327) HEMODYNAMICS:   INTAKE / OUTPUT: Intake/Output      12/19 0701 - 12/20 0700   P.O. 120   Total Intake(mL/kg) 120 (1.8)   Urine (mL/kg/hr) 400   Total Output 400   Net -280         PHYSICAL EXAMINATION: General:  Thin, NAD Neuro:  Nonfocal, alert and appropriate HEENT:  PERRL, neck supple w/o LAD Cardiovascular:  RRR, no  murmurs Lungs:  Good air movement.  Fine Insp crackles in R base.  Otherwise clear without other crackles or wheezes Abdomen:  +BS, NTTP Musculoskeletal:  Normal tone Skin:  No rashes or edema  LABS:  CBC  Recent Labs Lab 05/09/14 1635  WBC 3.6*  HGB 9.3*  HCT 27.8*  PLT 557*   Coag's No results for input(s): APTT, INR in the last 168 hours. BMET  Recent Labs Lab 05/09/14 1635  NA 136*  K 3.6*  CL 97  CO2 25  BUN 11  CREATININE 0.45*  GLUCOSE 115*   Electrolytes  Recent Labs Lab 05/09/14 1635  CALCIUM 8.9   Sepsis Markers No results for input(s):  LATICACIDVEN, PROCALCITON, O2SATVEN in the last 168 hours. ABG No results for input(s): PHART, PCO2ART, PO2ART in the last 168 hours. Liver Enzymes  Recent Labs Lab 05/09/14 1635  AST <5  ALT <5  ALKPHOS 110  BILITOT 0.4  ALBUMIN 2.9*   Cardiac Enzymes  Recent Labs Lab 05/10/14 0006  TROPONINI <0.30   Glucose No results for input(s): GLUCAP in the last 168 hours.  Imaging Dg Chest 2 View  05/09/2014   CLINICAL DATA:  Weakness, shortness of breath, cough.  EXAM: CHEST  2 VIEW  COMPARISON:  03/05/2013  FINDINGS: Multiple monitoring leads overlie the patient. Stable cardiac and mediastinal contours given differences in patient rotation. New focal heterogeneous opacity within the right lung base. No pleural effusion or pneumothorax. Mid thoracic spine degenerative change.  IMPRESSION: New focal heterogeneous opacity within the peripheral right lower lung, potentially infectious in etiology. Underlying mass could have a similar appearance. Recommend short term radiographic followup to assess for resolution.   Electronically Signed   By: Annia Belt M.D.   On: 05/09/2014 17:35   Ct Head Wo Contrast  05/09/2014   CLINICAL DATA:  Week and slurred speech.  EXAM: CT HEAD WITHOUT CONTRAST  TECHNIQUE: Contiguous axial images were obtained from the base of the skull through the vertex without intravenous contrast.   COMPARISON:  None.  FINDINGS: Ventricles and sulci are appropriate for patient's age. No evidence for acute cortically based infarct, intracranial hemorrhage, mass lesion or mass effect. Periventricular subcortical white matter hypodensity compatible with chronic small vessel ischemic change. Orbits are unremarkable. Mastoid air cells are well aerated. Mucosal thickening involving sphenoid sinus ethmoid air cells. Calvarium is intact.  IMPRESSION: No acute intracranial process.  Sphenoid sinus and ethmoid air cell mucosal thickening.   Electronically Signed   By: Annia Belt M.D.   On: 05/09/2014 17:39   Ct Chest W Contrast  05/09/2014   CLINICAL DATA:  Bilateral lower leg weakness.  Slurred speech.  EXAM: CT CHEST WITH CONTRAST  TECHNIQUE: Multidetector CT imaging of the chest was performed during intravenous contrast administration.  CONTRAST:  80mL OMNIPAQUE IOHEXOL 300 MG/ML  SOLN  COMPARISON:  Earlier same date chest radiograph  FINDINGS: Visualized neck base is unremarkable. No enlarged axillary lymphadenopathy. Prominent right hilar lymph node measuring 0.8 cm. Normal heart size. No pericardial effusion. Calcified atherosclerotic plaque involving the coronary arteries.  Central airways are patent. There are tree-in-bud nodular opacities and associated ground-glass opacities within the subpleural right lower lobe. There are additional scattered tree-in-bud opacities within the peripheral right middle lobe, right upper lobe, left upper lobe and left lower lobes. No pleural effusion or pneumothorax.  Visualization of the upper abdomen demonstrates fatty deposition adjacent to the falciform ligament. Post cholecystectomy. Bilateral low-attenuation renal lesions, too small to accurately characterize.  Mid thoracic spine degenerative change.  IMPRESSION: Right lower lobe consolidative opacity with associated tree-in-bud nodular opacities within the right lower, left lower, left upper, right upper and right  middle lobes. Overall findings are suggestive of an airway disseminated infectious process. Recommend short term radiographic followup to ensure resolution.  Prominent right hilar lymph node, likely reactive in etiology.   Electronically Signed   By: Annia Belt M.D.   On: 05/09/2014 20:27     CXR: RLL opacity, otherwise clear  ASSESSMENT / PLAN:   68yof with PMH of parkinson's presents with brief episode of aphasia, found to have CT chest findings concerning for infection.    Pt's hx suggests a possible resp  illness that has been slowly resolving over past few weeks.  A viral illness could certainly explain this and infections with adenovirus, rhinovirus and parainfluenza have been common in the area lately.  It is also possible that she is a chronic aspirator given hx of parkinson's x 8 years.  An atypical PNA is possible as well but pt does not appear acutely ill.  MAC is also on the diff dx but lower than other dx given rather short duration of symptoms (4 weeks).  Would rec:  Change zosyn to unasyn as pt unlikely to have pseudomonal PNA Agree with continuing Azithro.  Combo of unasyn and azithro should cover for both aspiration and atypical PNA well Swallow eval Blood cx  Resp viral panel Will need f/u imaging in 4-6 weeks to document resolution of findings, CXR should be adequate    TODAY'S SUMMARY: Admitted for aphasia and cough.  CT chests with opacities concerning for infection.  Getting broad Abx.  Clinically much improved   C. Ignacia Marvel, MD Pulmonary and Critical Care Medicine [facility] Pager: [phone]   05/10/2014, 5:08 AM

## 2014-05-10 NOTE — Significant Event (Signed)
Rapid Response Event Note  Overview: Time Called: 1840 Event Type: Neurologic  Initial Focused Assessment: pt sitting up in bed with meal tray in front of her, attempting to eat. Pt very anxious, saying she doesn't know what is wrong, saying I'm dying.  Oriented x4, PEARL, grips strong bil, no facial droop or arm drift.     Interventions:Placed on monitor, physician was paged  by bedside RN.  Pt enc to relax.  Daughter in and said pt had similar episode yesterday however it did not last this long.     Event Summary: Pt much calmer, being reassured by daughter.  BP 127/65 HR 112 R 19 sat 94 on RA.  Will cont to monitor.   at      at          [place], Zannie KehrStephanie S

## 2014-05-11 ENCOUNTER — Inpatient Hospital Stay (HOSPITAL_COMMUNITY): Payer: Medicare HMO

## 2014-05-11 ENCOUNTER — Inpatient Hospital Stay (HOSPITAL_COMMUNITY)
Admission: EM | Admit: 2014-05-11 | Discharge: 2014-05-11 | Disposition: A | Discharge status: Home / Self Care | Payer: Medicare HMO | Source: Home / Self Care | Attending: Internal Medicine | Admitting: Internal Medicine

## 2014-05-11 DIAGNOSIS — G20 Parkinson's disease: Secondary | ICD-10-CM

## 2014-05-11 DIAGNOSIS — J690 Pneumonitis due to inhalation of food and vomit: Secondary | ICD-10-CM

## 2014-05-11 DIAGNOSIS — N390 Urinary tract infection, site not specified: Secondary | ICD-10-CM

## 2014-05-11 DIAGNOSIS — E876 Hypokalemia: Secondary | ICD-10-CM

## 2014-05-11 DIAGNOSIS — T17998D Other foreign object in respiratory tract, part unspecified causing other injury, subsequent encounter: Secondary | ICD-10-CM

## 2014-05-11 DIAGNOSIS — R569 Unspecified convulsions: Secondary | ICD-10-CM

## 2014-05-11 DIAGNOSIS — J189 Pneumonia, unspecified organism: Secondary | ICD-10-CM

## 2014-05-11 LAB — CBC
HEMATOCRIT: 26.1 % — AB (ref 36.0–46.0)
Hemoglobin: 8.7 g/dL — ABNORMAL LOW (ref 12.0–15.0)
MCH: 31.8 pg (ref 26.0–34.0)
MCHC: 33.3 g/dL (ref 30.0–36.0)
MCV: 95.3 fL (ref 78.0–100.0)
PLATELETS: 534 10*3/uL — AB (ref 150–400)
RBC: 2.74 MIL/uL — ABNORMAL LOW (ref 3.87–5.11)
RDW: 13.7 % (ref 11.5–15.5)
WBC: 2.8 10*3/uL — ABNORMAL LOW (ref 4.0–10.5)

## 2014-05-11 LAB — URINE CULTURE
CULTURE: NO GROWTH
Colony Count: NO GROWTH

## 2014-05-11 LAB — TSH: TSH: 0.342 u[IU]/mL — AB (ref 0.350–4.500)

## 2014-05-11 MED ORDER — ENSURE COMPLETE PO LIQD
237.0000 mL | ORAL | Status: DC
  Administered 2014-05-12: 237 mL via ORAL

## 2014-05-11 MED ORDER — BOOST / RESOURCE BREEZE PO LIQD
1.0000 | Freq: Two times a day (BID) | ORAL | Status: DC
  Administered 2014-05-12: 1 via ORAL

## 2014-05-11 MED ORDER — GADOBENATE DIMEGLUMINE 529 MG/ML IV SOLN
15.0000 mL | Freq: Once | INTRAVENOUS | Status: AC | PRN
  Administered 2014-05-11: 14 mL via INTRAVENOUS

## 2014-05-11 NOTE — Progress Notes (Signed)
[facility]  S: Ms. Donnetta SimpersMulnix feels well this AM, no cough or dyspnea O: Filed Vitals:   05/10/14 1954 05/10/14 2157 05/11/14 0134 05/11/14 0437  BP: 145/60 131/60 115/59 124/62  Pulse: 119 104 94 94  Temp: 97.7 F (36.5 C) 98.9 F (37.2 C) 99.4 F (37.4 C) 98.6 F (37 C)  TempSrc: Axillary Oral Oral Oral  Resp: 24 20 20 20   Height:      Weight:      SpO2: 98% 97% 96% 93%   Gen: well appearing HEENT: NCAT, EOMi PULM: few crackles R base, otherwise clear CV: Normal S1 and S2 no murmurs gallops or rubs Abdomen soft nontender Extremities warm trace edema Neuro alert and oriented 4  I have personally reviewed the CT scan of her chest from 05/09/2014. It shows diffuse tree in bud abnormalities as well as a right lower lobe mild area of groundglass and interstitial thickening consistent with pneumonia  Impression: #1 aspiration pneumonia, clinically improving #2 oropharyngeal aspiration #3 Parkinson's disease  Plan: Would change antibiotics to oral, Augmentin showed suffice Would plan on treating for 5 days total I have ordered a speech evaluation for swallowing eval  I have counseled the patient and her son-in-law on oral pharyngeal aspiration and explained that the best approach at this point is to undergo swallow evaluation and follow-up with speech therapy recommendations for appropriate diet and swallowing technique.  Pulmonary and critical care medicine will sign off, call if questions  Heber CarolinaBrent Oren Barella, MD [facility] Pager: [phone] Cell: [phone] If no response, call [phone]

## 2014-05-11 NOTE — Progress Notes (Signed)
Pt back from CT. Pt is alert, orient, and able to respond verbally, following commands. Pt's daughter and son in law at bedside. Will continue to monitor pt.

## 2014-05-11 NOTE — Progress Notes (Signed)
Pt to CT with rapid response nurse and NP.

## 2014-05-11 NOTE — Evaluation (Signed)
SLP Cancellation Note  Patient Details Name: Angel BarterSusan Bass MRN: [id number] DOB: 11/30/1945   Cancelled treatment:       Reason Eval/Treat Not Completed:  (xray unable to perform MBS today, will reattempt in am)   Donavan Burnetamara Kiele Heavrin, MS [facility] [phone]

## 2014-05-11 NOTE — Consult Note (Signed)
Reason for Consult: New-onset seizure activity.  HPI:                                                                                                                                          Angel Bass is an 68 y.o. female history of Parkinson's disease, hypothyroidism, macular degeneration and urinary tract infection, admitted for management of community-acquired pneumonia, as well as evaluation following transient speech difficulty with associated flashing lights. Patient had a recurrent episode last night of slurred speech followed by generalized tonic clonic type activity followed by confusion. She was given a loading dose of Keppra following 1 mg of Ativan, and started on maintenance dose of Keppra 500 mg every 12 hours. CT scan of her head showed no acute intracranial abnormality. MRI study was deferred because of metallic rods and forearms. She has no previous history of seizure activity. She's been taking Ultram 50 mg 3 times a day when necessary for pain.  Past Medical History  Diagnosis Date  . Parkinson disease   . Thyroid disease   . UTI (urinary tract infection)   . Fractured elbow   . Hypothyroidism   . Anxiety   . Shortness of breath     with exertion  . Constipation   . Arthritis   . Macular degeneration disease     Past Surgical History  Procedure Laterality Date  . Left elbow surgery    . Trigger finger x 5       thumb- both,   . Carpel tunnel Right   . Cholecystectomy    . Shoulder surgery for spur removal Right   . Open reduction internal fixation (orif) distal radial fracture Right 03/05/2013    Procedure: OPEN REDUCTION INTERNAL FIXATION (ORIF) RIGHT DISTAL RADIUS FRACTURE;  Surgeon: Marianna Payment, MD;  Location: [facility];  Service: Orthopedics;  Laterality: Right;    Family History  Problem Relation Age of Onset  . CAD Other   . Depression Other     Social History:  reports that she has quit smoking. She does not have any smokeless tobacco history on  file. She reports that she does not drink alcohol or use illicit drugs.  No Known Allergies  MEDICATIONS:  I have reviewed the patient's current medications.   ROS:                                                                                                                                       History obtained from chart review and the patient  General ROS: negative for - chills, fatigue, fever, night sweats, weight gain or weight loss Psychological ROS: negative for - behavioral disorder, hallucinations, memory difficulties, mood swings or suicidal ideation Ophthalmic ROS: negative for - blurry vision, double vision, eye pain or loss of vision ENT ROS: negative for - epistaxis, nasal discharge, oral lesions, sore throat, tinnitus or vertigo Allergy and Immunology ROS: negative for - hives or itchy/watery eyes Hematological and Lymphatic ROS: negative for - bleeding problems, bruising or swollen lymph nodes Endocrine ROS: negative for - galactorrhea, hair pattern changes, polydipsia/polyuria or temperature intolerance Respiratory ROS: CAP, as noted in present illness Cardiovascular ROS: negative for - chest pain, dyspnea on exertion, edema or irregular heartbeat Gastrointestinal ROS: negative for - abdominal pain, diarrhea, hematemesis, nausea/vomiting or stool incontinence Genito-Urinary ROS: negative for - dysuria, hematuria, incontinence or urinary frequency/urgency Musculoskeletal ROS: negative for - joint swelling or muscular weakness Neurological ROS: as noted in HPI Dermatological ROS: negative for rash and skin lesion changes   Blood pressure 124/62, pulse 94, temperature 98.6 F (37 C), temperature source Oral, resp. rate 20, height '5\' 6"'  (1.676 m), weight 65.7 kg (144 lb 13.5 oz), SpO2 93 %. Appearance was that of a slender slightly older appearing lady who was  alert and in no acute distress. Mental status was normal. HEENT: Unremarkable Neck was supple with no tenderness. Chest was clear to auscultation. Heart rate and rhythm were normal. Heart sounds were normal.   Neurologic Examination:                                                                                                      Mental Status: Alert, oriented, thought content appropriate.  Speech fluent without evidence of aphasia. Able to follow commands without difficulty. Cranial Nerves: II-Visual fields were normal. III/IV/VI-Pupils were equal and reacted. Extraocular movements were full and conjugate.    V/VII-no facial numbness and no facial weakness. VIII-normal. X-normal speech and symmetrical palatal movement. XII-midline tongue extension Motor: 5/5 bilaterally with normal tone and bulk; no tremor was noted. Sensory: Normal throughout. Deep Tendon Reflexes: Trace to 1+ and symmetric in upper extremities and absent in lower extremities Plantars: Mute bilaterally Cerebellar: Normal finger-to-nose  testing. Carotid auscultation: Normal  No results found for: CHOL  Results for orders placed or performed during the hospital encounter of 05/09/14 (from the past 48 hour(s))  Comprehensive metabolic panel     Status: Abnormal   Collection Time: 05/09/14  4:35 PM  Result Value Ref Range   Sodium 136 (L) 137 - 147 mEq/L   Potassium 3.6 (L) 3.7 - 5.3 mEq/L   Chloride 97 96 - 112 mEq/L   CO2 25 19 - 32 mEq/L   Glucose, Bld 115 (H) 70 - 99 mg/dL   BUN 11 6 - 23 mg/dL   Creatinine, Ser 0.45 (L) 0.50 - 1.10 mg/dL   Calcium 8.9 8.4 - 10.5 mg/dL   Total Protein 6.3 6.0 - 8.3 g/dL   Albumin 2.9 (L) 3.5 - 5.2 g/dL   AST <5 0 - 37 U/L    Comment: REPEATED TO VERIFY   ALT <5 0 - 35 U/L    Comment: REPEATED TO VERIFY   Alkaline Phosphatase 110 39 - 117 U/L   Total Bilirubin 0.4 0.3 - 1.2 mg/dL   GFR calc non Af Amer >90 >90 mL/min   GFR calc Af Amer >90 >90 mL/min    Comment:  (NOTE) The eGFR has been calculated using the CKD EPI equation. This calculation has not been validated in all clinical situations. eGFR's persistently <90 mL/min signify possible Chronic Kidney Disease.    Anion gap 14 5 - 15  CBC with Differential     Status: Abnormal   Collection Time: 05/09/14  4:35 PM  Result Value Ref Range   WBC 3.6 (L) 4.0 - 10.5 K/uL   RBC 2.95 (L) 3.87 - 5.11 MIL/uL   Hemoglobin 9.3 (L) 12.0 - 15.0 g/dL   HCT 27.8 (L) 36.0 - 46.0 %   MCV 94.2 78.0 - 100.0 fL   MCH 31.5 26.0 - 34.0 pg   MCHC 33.5 30.0 - 36.0 g/dL   RDW 13.5 11.5 - 15.5 %   Platelets 557 (H) 150 - 400 K/uL   Neutrophils Relative % 70 43 - 77 %   Neutro Abs 2.5 1.7 - 7.7 K/uL   Lymphocytes Relative 18 12 - 46 %   Lymphs Abs 0.7 0.7 - 4.0 K/uL   Monocytes Relative 5 3 - 12 %   Monocytes Absolute 0.2 0.1 - 1.0 K/uL   Eosinophils Relative 7 (H) 0 - 5 %   Eosinophils Absolute 0.3 0.0 - 0.7 K/uL   Basophils Relative 0 0 - 1 %   Basophils Absolute 0.0 0.0 - 0.1 K/uL  Urinalysis, Routine w reflex microscopic     Status: Abnormal   Collection Time: 05/09/14  5:50 PM  Result Value Ref Range   Color, Urine YELLOW YELLOW   APPearance CLOUDY (A) CLEAR   Specific Gravity, Urine 1.020 1.005 - 1.030   pH 7.0 5.0 - 8.0   Glucose, UA NEGATIVE NEGATIVE mg/dL   Hgb urine dipstick NEGATIVE NEGATIVE   Bilirubin Urine NEGATIVE NEGATIVE   Ketones, ur NEGATIVE NEGATIVE mg/dL   Protein, ur NEGATIVE NEGATIVE mg/dL   Urobilinogen, UA 0.2 0.0 - 1.0 mg/dL   Nitrite POSITIVE (A) NEGATIVE   Leukocytes, UA MODERATE (A) NEGATIVE  Urine microscopic-add on     Status: Abnormal   Collection Time: 05/09/14  5:50 PM  Result Value Ref Range   Squamous Epithelial / LPF MANY (A) RARE   WBC, UA 3-6 <3 WBC/hpf   Bacteria, UA MANY (A) RARE  POC occult  blood, ED RN will collect     Status: None   Collection Time: 05/09/14  5:53 PM  Result Value Ref Range   Fecal Occult Bld NEGATIVE NEGATIVE  HIV antibody     Status:  None   Collection Time: 05/09/14  9:35 PM  Result Value Ref Range   HIV 1&2 Ab, 4th Generation NONREACTIVE NONREACTIVE    Comment: (NOTE) A NONREACTIVE HIV Ag/Ab result does not exclude HIV infection since the time frame for seroconversion is variable. If acute HIV infection is suspected, a HIV-1 RNA Qualitative TMA test is recommended. HIV-1/2 Antibody Diff         Not indicated. HIV-1 RNA, Qual TMA           Not indicated. PLEASE NOTE: This information has been disclosed to you from records whose confidentiality may be protected by state law. If your state requires such protection, then the state law prohibits you from making any further disclosure of the information without the specific written consent of the person to whom it pertains, or as otherwise permitted by law. A general authorization for the release of medical or other information is NOT sufficient for this purpose. The performance of this assay has not been clinically validated in patients less than 67 years old. Performed at [facility]   Troponin I (q 6hr x 3)     Status: None   Collection Time: 05/10/14 12:06 AM  Result Value Ref Range   Troponin I <0.30 <0.30 ng/mL    Comment:        Due to the release kinetics of cTnI, a negative result within the first hours of the onset of symptoms does not rule out myocardial infarction with certainty. If myocardial infarction is still suspected, repeat the test at appropriate intervals.   Strep pneumoniae urinary antigen     Status: None   Collection Time: 05/10/14 12:58 AM  Result Value Ref Range   Strep Pneumo Urinary Antigen NEGATIVE NEGATIVE    Comment:        Infection due to S. pneumoniae cannot be absolutely ruled out since the antigen present may be below the detection limit of the test. Performed at [facility]   Comprehensive metabolic panel     Status: Abnormal   Collection Time: 05/10/14  6:05 AM  Result Value Ref Range   Sodium 138  137 - 147 mEq/L   Potassium 3.9 3.7 - 5.3 mEq/L   Chloride 104 96 - 112 mEq/L   CO2 25 19 - 32 mEq/L   Glucose, Bld 117 (H) 70 - 99 mg/dL   BUN 7 6 - 23 mg/dL   Creatinine, Ser 0.56 0.50 - 1.10 mg/dL   Calcium 8.1 (L) 8.4 - 10.5 mg/dL   Total Protein 5.5 (L) 6.0 - 8.3 g/dL   Albumin 2.5 (L) 3.5 - 5.2 g/dL   AST <5 0 - 37 U/L    Comment: CONSISTENT WITH PREVIOUS RESULT   ALT <5 0 - 35 U/L    Comment: CONSISTENT WITH PREVIOUS RESULT   Alkaline Phosphatase 101 39 - 117 U/L   Total Bilirubin 0.3 0.3 - 1.2 mg/dL   GFR calc non Af Amer >90 >90 mL/min   GFR calc Af Amer >90 >90 mL/min    Comment: (NOTE) The eGFR has been calculated using the CKD EPI equation. This calculation has not been validated in all clinical situations. eGFR's persistently <90 mL/min signify possible Chronic Kidney Disease.    Anion gap 9  5 - 15  CBC WITH DIFFERENTIAL     Status: Abnormal   Collection Time: 05/10/14  6:05 AM  Result Value Ref Range   WBC 3.1 (L) 4.0 - 10.5 K/uL   RBC 2.59 (L) 3.87 - 5.11 MIL/uL   Hemoglobin 8.3 (L) 12.0 - 15.0 g/dL   HCT 24.6 (L) 36.0 - 46.0 %   MCV 95.0 78.0 - 100.0 fL   MCH 32.0 26.0 - 34.0 pg   MCHC 33.7 30.0 - 36.0 g/dL   RDW 13.5 11.5 - 15.5 %   Platelets 495 (H) 150 - 400 K/uL   Neutrophils Relative % 61 43 - 77 %   Neutro Abs 1.9 1.7 - 7.7 K/uL   Lymphocytes Relative 22 12 - 46 %   Lymphs Abs 0.7 0.7 - 4.0 K/uL   Monocytes Relative 6 3 - 12 %   Monocytes Absolute 0.2 0.1 - 1.0 K/uL   Eosinophils Relative 11 (H) 0 - 5 %   Eosinophils Absolute 0.4 0.0 - 0.7 K/uL   Basophils Relative 0 0 - 1 %   Basophils Absolute 0.0 0.0 - 0.1 K/uL  Vitamin B12     Status: Abnormal   Collection Time: 05/10/14  6:05 AM  Result Value Ref Range   Vitamin B-12 1061 (H) 211 - 911 pg/mL    Comment: Performed at [facility]  Folate     Status: Abnormal   Collection Time: 05/10/14  6:05 AM  Result Value Ref Range   Folate 3.2 (L) ng/mL    Comment: (NOTE) Reference  Ranges        Deficient:       0.4 - 3.3 ng/mL        Indeterminate:   3.4 - 5.4 ng/mL        Normal:              > 5.4 ng/mL Performed at [facility]   Iron and TIBC     Status: Abnormal   Collection Time: 05/10/14  6:05 AM  Result Value Ref Range   Iron 127 42 - 135 ug/dL   TIBC 233 (L) 250 - 470 ug/dL   Saturation Ratios 55 20 - 55 %   UIBC 106 (L) 125 - 400 ug/dL    Comment: Performed at [facility]  Ferritin     Status: Abnormal   Collection Time: 05/10/14  6:05 AM  Result Value Ref Range   Ferritin 405 (H) 10 - 291 ng/mL    Comment: Performed at [facility]  Reticulocytes     Status: Abnormal   Collection Time: 05/10/14  6:05 AM  Result Value Ref Range   Retic Ct Pct <0.4 (L) 0.4 - 3.1 %   RBC. 2.59 (L) 3.87 - 5.11 MIL/uL   Retic Count, Manual NOT CALCULATED 19.0 - 186.0 K/uL  Prealbumin     Status: Abnormal   Collection Time: 05/10/14  6:05 AM  Result Value Ref Range   Prealbumin 15.5 (L) 17.0 - 34.0 mg/dL    Comment: Performed at [facility]  Troponin I (q 6hr x 3)     Status: None   Collection Time: 05/10/14  6:06 AM  Result Value Ref Range   Troponin I <0.30 <0.30 ng/mL    Comment:        Due to the release kinetics of cTnI, a negative result within the first hours of the onset of symptoms does not rule out myocardial infarction with  certainty. If myocardial infarction is still suspected, repeat the test at appropriate intervals.   Culture, expectorated sputum-assessment     Status: None   Collection Time: 05/10/14  6:31 AM  Result Value Ref Range   Specimen Description SPUTUM    Special Requests Normal    Sputum evaluation      THIS SPECIMEN IS ACCEPTABLE. RESPIRATORY CULTURE REPORT TO FOLLOW.   Report Status 05/10/2014 FINAL   Culture, respiratory (NON-Expectorated)     Status: None (Preliminary result)   Collection Time: 05/10/14  6:31 AM  Result Value Ref Range   Specimen Description SPUTUM    Special Requests  NONE    Gram Stain      MODERATE WBC PRESENT, PREDOMINANTLY PMN RARE SQUAMOUS EPITHELIAL CELLS PRESENT MODERATE YEAST FEW GRAM NEGATIVE RODS Performed at [facility]    Culture      Culture reincubated for better growth Performed at [facility]    Report Status PENDING   Culture, blood (routine x 2)     Status: None (Preliminary result)   Collection Time: 05/10/14  6:49 AM  Result Value Ref Range   Specimen Description BLOOD LEFT ARM    Special Requests BOTTLES DRAWN AEROBIC ONLY 10CC BLUE BOTTLE    Culture  Setup Time      05/10/2014 16:37 Performed at [facility]    Culture             BLOOD CULTURE RECEIVED NO GROWTH TO DATE CULTURE WILL BE HELD FOR 5 DAYS BEFORE ISSUING A FINAL NEGATIVE REPORT Performed at [facility]    Report Status PENDING   Culture, blood (routine x 2)     Status: None (Preliminary result)   Collection Time: 05/10/14  6:56 AM  Result Value Ref Range   Specimen Description BLOOD LEFT HAND    Special Requests BOTTLES DRAWN AEROBIC ONLY [place]    Culture  Setup Time      05/10/2014 16:37 Performed at [facility] NO GROWTH TO DATE CULTURE WILL BE HELD FOR 5 DAYS BEFORE ISSUING A FINAL NEGATIVE REPORT Performed at [facility]    Report Status PENDING   Troponin I (q 6hr x 3)     Status: None   Collection Time: 05/10/14 12:24 PM  Result Value Ref Range   Troponin I <0.30 <0.30 ng/mL    Comment:        Due to the release kinetics of cTnI, a negative result within the first hours of the onset of symptoms does not rule out myocardial infarction with certainty. If myocardial infarction is still suspected, repeat the test at appropriate intervals.   Glucose, capillary     Status: Abnormal   Collection Time: 05/10/14  8:04 PM  Result Value Ref Range   Glucose-Capillary 126 (H) 70 - 99 mg/dL  Blood gas, arterial     Status: Abnormal    Collection Time: 05/10/14  8:12 PM  Result Value Ref Range   FIO2 0.21 %   pH, Arterial 7.440 7.350 - 7.450   pCO2 arterial 31.3 (L) 35.0 - 45.0 mmHg   pO2, Arterial 59.6 (L) 80.0 - 100.0 mmHg   Bicarbonate 21.0 20.0 - 24.0 mEq/L   TCO2 18.9 0 - 100 mmol/L   Acid-base deficit 2.0 0.0 - 2.0 mmol/L   O2 Saturation 91.0 %   Patient temperature  97.7    Collection site LEFT RADIAL    Drawn by [phone]    Sample type ARTERIAL DRAW    Allens test (pass/fail) PASS PASS  TSH     Status: Abnormal   Collection Time: 05/11/14  5:00 AM  Result Value Ref Range   TSH 0.342 (L) 0.350 - 4.500 uIU/mL    Comment: Performed at [facility]  CBC     Status: Abnormal   Collection Time: 05/11/14  5:00 AM  Result Value Ref Range   WBC 2.8 (L) 4.0 - 10.5 K/uL   RBC 2.74 (L) 3.87 - 5.11 MIL/uL   Hemoglobin 8.7 (L) 12.0 - 15.0 g/dL   HCT 26.1 (L) 36.0 - 46.0 %   MCV 95.3 78.0 - 100.0 fL   MCH 31.8 26.0 - 34.0 pg   MCHC 33.3 30.0 - 36.0 g/dL   RDW 13.7 11.5 - 15.5 %   Platelets 534 (H) 150 - 400 K/uL    Dg Chest 2 View  05/09/2014   CLINICAL DATA:  Weakness, shortness of breath, cough.  EXAM: CHEST  2 VIEW  COMPARISON:  03/05/2013  FINDINGS: Multiple monitoring leads overlie the patient. Stable cardiac and mediastinal contours given differences in patient rotation. New focal heterogeneous opacity within the right lung base. No pleural effusion or pneumothorax. Mid thoracic spine degenerative change.  IMPRESSION: New focal heterogeneous opacity within the peripheral right lower lung, potentially infectious in etiology. Underlying mass could have a similar appearance. Recommend short term radiographic followup to assess for resolution.   Electronically Signed   By: Lovey Newcomer M.D.   On: 05/09/2014 17:35   Ct Head Wo Contrast  05/10/2014   CLINICAL DATA:  Sudden onset difficulty in walking. Decreased level consciousness.  EXAM: CT HEAD WITHOUT CONTRAST  TECHNIQUE: Contiguous axial images were obtained  from the base of the skull through the vertex without intravenous contrast.  COMPARISON:  Head CT 05/09/2014  FINDINGS: No acute intracranial hemorrhage. No focal mass lesion. No CT evidence of acute infarction. No midline shift or mass effect. No hydrocephalus. Basilar cisterns are patent.  Mastoid air cells are clear. There is fluid in the sphenoid sinus. Frontal sinuses are clear.  IMPRESSION: 1. No acute intracranial findings.  No change from prior. 2. Chronic inflammation of the sphenoid sinuses   Electronically Signed   By: Suzy Bouchard M.D.   On: 05/10/2014 20:45   Ct Head Wo Contrast  05/09/2014   CLINICAL DATA:  Week and slurred speech.  EXAM: CT HEAD WITHOUT CONTRAST  TECHNIQUE: Contiguous axial images were obtained from the base of the skull through the vertex without intravenous contrast.  COMPARISON:  None.  FINDINGS: Ventricles and sulci are appropriate for patient's age. No evidence for acute cortically based infarct, intracranial hemorrhage, mass lesion or mass effect. Periventricular subcortical white matter hypodensity compatible with chronic small vessel ischemic change. Orbits are unremarkable. Mastoid air cells are well aerated. Mucosal thickening involving sphenoid sinus ethmoid air cells. Calvarium is intact.  IMPRESSION: No acute intracranial process.  Sphenoid sinus and ethmoid air cell mucosal thickening.   Electronically Signed   By: Lovey Newcomer M.D.   On: 05/09/2014 17:39   Ct Chest W Contrast  05/09/2014   CLINICAL DATA:  Bilateral lower leg weakness.  Slurred speech.  EXAM: CT CHEST WITH CONTRAST  TECHNIQUE: Multidetector CT imaging of the chest was performed during intravenous contrast administration.  CONTRAST:  49m OMNIPAQUE IOHEXOL 300 MG/ML  SOLN  COMPARISON:  Earlier  same date chest radiograph  FINDINGS: Visualized neck base is unremarkable. No enlarged axillary lymphadenopathy. Prominent right hilar lymph node measuring 0.8 cm. Normal heart size. No pericardial  effusion. Calcified atherosclerotic plaque involving the coronary arteries.  Central airways are patent. There are tree-in-bud nodular opacities and associated ground-glass opacities within the subpleural right lower lobe. There are additional scattered tree-in-bud opacities within the peripheral right middle lobe, right upper lobe, left upper lobe and left lower lobes. No pleural effusion or pneumothorax.  Visualization of the upper abdomen demonstrates fatty deposition adjacent to the falciform ligament. Post cholecystectomy. Bilateral low-attenuation renal lesions, too small to accurately characterize.  Mid thoracic spine degenerative change.  IMPRESSION: Right lower lobe consolidative opacity with associated tree-in-bud nodular opacities within the right lower, left lower, left upper, right upper and right middle lobes. Overall findings are suggestive of an airway disseminated infectious process. Recommend short term radiographic followup to ensure resolution.  Prominent right hilar lymph node, likely reactive in etiology.   Electronically Signed   By: Lovey Newcomer M.D.   On: 05/09/2014 20:27   Assessment/Plan: 68 year old lady with history of Parkinson's disease and hypothyroidism resenting with new onset partial seizure activity with secondary generalization. Etiology is unclear. No structural abnormality noted on CT scan of the head. EEG performed earlier today showed no epileptiform activity.  Recommendations: 1. Continue Keppra 500 mg twice a day. 2. Consider discontinuing Ultram and substituting with analgesic medication with less epileptogenic potential. 3. MRI of brain without and with contrast, as metallic rods in the arms risk surgically placed within the last 2 years and are likely MRI compatible. Please discuss with radiologist.  C.R. Nicole Kindred, MD [facility] [phone]  05/11/2014, 11:32 AM

## 2014-05-11 NOTE — Progress Notes (Signed)
Went to check on pt after receiving report from previous Charity fundraiserN. Pt was alert and oriented, appeared to very anxious. Pt was talking to me then she started to slur her words and was not making sense. Then pt was no longer to respond verbally. Called the NP on call and rapid response to assess pt.

## 2014-05-11 NOTE — Progress Notes (Signed)
EEG completed; results pending.    

## 2014-05-11 NOTE — Progress Notes (Signed)
[facility] PROGRESS NOTE  Angel Bass KVQ:[id number] DOB: 05/11/1946 DOA: 05/09/2014 PCP: No primary care provider on file.   brief narrative 68 year old female with history of Parkinson disease, hypothyroidism, arthritis and macular degeneration visiting [place] from [place] presented with 2-3 weeks of cough with productive thick brown-green sputum. She also had episode of nausea and vomiting. Reports poor appetite with weight loss. She has a history of smoking which she quit 6 weeks back. [place] the airport to the [place] she had transient slurred speech with an episode of light flashing in her eyes. Patient in the ED had a normal head CT but a chest CT showed right lower lobe consolidation associated with tree-in-bud nodular opacity in multiple lobes. Patient was also found to have UTI . She has been treated with Bactrim by her primary doctor for the past 3 days.  Assessment/Plan: aspiration/atypical pneumonia Possibly atypical versus aspiration pneumonia with abnormal CT findings. Seen by pulmonary and switched antibiotics to Unasyn and azithromycin. Follow cultures including AFB, and respiratory viral panel, and urine Legionella antigen. Urine strep antigen negative. -given likelihood for aspiration, abx switched to oral Augmentin.  Tmax of 100.43F Supportive care with tylenol and antitussives.  Patient will need follow-up x-ray in 4-6 weeks to see for resolution of the pneumonia.   Seizures Acute episodes of witnessed seizures overnight on 12/20. Head CT unremarkable. Loaded with IV keppra. Neurology following. Ordered MRI brain without and with contrast. Given hx of screws and rods in b/l forearm, spoke with radiologist and recommended that they are MRI compatible. Appreciate neuro recs.  Seizure precautions. Follow EEG results. Will d/c tramadol give its potential to reduce seizure threshold.   Dysphagia has mild oral phase dysphagia on swallow eval. Ordered MBS  for tomorrow Recommend regular diet with thin liquid for now    Parkinson's disease Continue home medications  Anemia Check stool for occult blood  UTI Follow urine culture. On augmentin now.  Hypokalemia Replenish  Severe malnutrition Seen by nutrition consult and added supplements  DVT prophylaxis: Subcutaneous Lovenox  Diet: Regular with thin liquid. Pending MBS    Code Status: DO NOT RESUSCITATE Family Communication: daughters  at bedside  Disposition Plan: Home once improved   Consultants: Pulmonary   Procedures:  None  Antibiotics:  IV Zosyn 12/19  IV azithromycin 12/19--12/21  Unasyn 12/20-12/21  augmentin 12/21--  HPI/Subjective: Patient seen and examined. Had an episode of seizure like activity overnight ( rigidity with tonic clonic activity lasting 2 minutes) and incontinent of urine. Was lethargic and confused initially with word finding difficulty. vitals stable. Labs this am normal except low cbc indices. Head CT repeated and was normal. Given loading dose of keppra and neurology consulted.  Objective: Filed Vitals:   05/11/14 0437  BP: 124/62  Pulse: 94  Temp: 98.6 F (37 C)  Resp: 20    Intake/Output Summary (Last 24 hours) at 05/11/14 1413 Last data filed at 05/11/14 1200  Gross per 24 hour  Intake    240 ml  Output   1400 ml  Net  -1160 ml   Filed Weights   05/09/14 2327  Weight: 65.7 kg (144 lb 13.5 oz)    Exam:   General: Elderly female in no acute distress  HEENT: No pallor, moist oral mucosa  Chest: bibasilar crackles,  no rhonchi or wheeze  Cardiovascular: Normal S1 and S2, no murmurs  Abdomen: Soft, nondistended, nontender  Extremities: Warm, no edema  CNS: Alert and oriented, no focal deficit  Data  Reviewed: Basic Metabolic Panel:  Recent Labs Lab 05/09/14 1635 05/10/14 0605  NA 136* 138  K 3.6* 3.9  CL 97 104  CO2 25 25  GLUCOSE 115* 117*  BUN 11 7  CREATININE 0.45* 0.56  CALCIUM 8.9  8.1*   Liver Function Tests:  Recent Labs Lab 05/09/14 1635 05/10/14 0605  AST <5 <5  ALT <5 <5  ALKPHOS 110 101  BILITOT 0.4 0.3  PROT 6.3 5.5*  ALBUMIN 2.9* 2.5*   No results for input(s): LIPASE, AMYLASE in the last 168 hours. No results for input(s): AMMONIA in the last 168 hours. CBC:  Recent Labs Lab 05/09/14 1635 05/10/14 0605 05/11/14 0500  WBC 3.6* 3.1* 2.8*  NEUTROABS 2.5 1.9  --   HGB 9.3* 8.3* 8.7*  HCT 27.8* 24.6* 26.1*  MCV 94.2 95.0 95.3  PLT 557* 495* 534*   Cardiac Enzymes:  Recent Labs Lab 05/10/14 0006 05/10/14 0606 05/10/14 1224  TROPONINI <0.30 <0.30 <0.30   BNP (last 3 results) No results for input(s): PROBNP in the last 8760 hours. CBG:  Recent Labs Lab 05/10/14 2004  GLUCAP 126*    Recent Results (from the past 240 hour(s))  Urine culture     Status: None   Collection Time: 05/09/14  7:08 PM  Result Value Ref Range Status   Specimen Description URINE, CLEAN CATCH  Final   Special Requests NONE  Final   Culture  Setup Time   Final    05/10/2014 13:54 Performed at [facility]    Colony Count NO GROWTH Performed at [facility]   Final   Culture NO GROWTH Performed at [facility]   Final   Report Status 05/11/2014 FINAL  Final  Culture, expectorated sputum-assessment     Status: None   Collection Time: 05/10/14  6:31 AM  Result Value Ref Range Status   Specimen Description SPUTUM  Final   Special Requests Normal  Final   Sputum evaluation   Final    THIS SPECIMEN IS ACCEPTABLE. RESPIRATORY CULTURE REPORT TO FOLLOW.   Report Status 05/10/2014 FINAL  Final  AFB culture with smear     Status: None (Preliminary result)   Collection Time: 05/10/14  6:31 AM  Result Value Ref Range Status   Specimen Description SPUTUM  Final   Special Requests Immunocompromised  Final   Acid Fast Smear   Final    NO ACID FAST BACILLI SEEN Performed at [facility]    Culture   Final    CULTURE WILL BE  EXAMINED FOR 6 WEEKS BEFORE ISSUING A FINAL REPORT Performed at [facility]    Report Status PENDING  Incomplete  Culture, respiratory (NON-Expectorated)     Status: None (Preliminary result)   Collection Time: 05/10/14  6:31 AM  Result Value Ref Range Status   Specimen Description SPUTUM  Final   Special Requests NONE  Final   Gram Stain   Final    MODERATE WBC PRESENT, PREDOMINANTLY PMN RARE SQUAMOUS EPITHELIAL CELLS PRESENT MODERATE YEAST FEW GRAM NEGATIVE RODS Performed at [facility]    Culture   Final    Culture reincubated for better growth Performed at [facility]    Report Status PENDING  Incomplete  Culture, blood (routine x 2)     Status: None (Preliminary result)   Collection Time: 05/10/14  6:49 AM  Result Value Ref Range Status   Specimen Description BLOOD LEFT ARM  Final   Special Requests  BOTTLES DRAWN AEROBIC ONLY 10CC BLUE BOTTLE  Final   Culture  Setup Time   Final    05/10/2014 16:37 [facility]    Culture   Final           BLOOD CULTURE RECEIVED NO GROWTH TO DATE CULTURE WILL BE HELD FOR 5 DAYS BEFORE ISSUING A FINAL NEGATIVE REPORT [facility]    Report Status PENDING  Incomplete  Culture, blood (routine x 2)     Status: None (Preliminary result)   Collection Time: 05/10/14  6:56 AM  Result Value Ref Range Status   Specimen Description BLOOD LEFT HAND  Final   Special Requests BOTTLES DRAWN AEROBIC ONLY 1CC BLUE BOTTLE  Final   Culture  Setup Time   Final    05/10/2014 16:37 [facility]    Culture   Final           BLOOD CULTURE RECEIVED NO GROWTH TO DATE CULTURE WILL BE HELD FOR 5 DAYS BEFORE ISSUING A FINAL NEGATIVE REPORT [facility]    Report Status PENDING  Incomplete     Studies: Dg Chest 2 View  05/09/2014   CLINICAL DATA:  Weakness, shortness of breath, cough.  EXAM: CHEST  2 VIEW  COMPARISON:  03/05/2013  FINDINGS:  Multiple monitoring leads overlie the patient. Stable cardiac and mediastinal contours given differences in patient rotation. New focal heterogeneous opacity within the right lung base. No pleural effusion or pneumothorax. Mid thoracic spine degenerative change.  IMPRESSION: New focal heterogeneous opacity within the peripheral right lower lung, potentially infectious in etiology. Underlying mass could have a similar appearance. Recommend short term radiographic followup to assess for resolution.   Electronically Signed   By: Annia Beltrew  Davis M.D.   On: 05/09/2014 17:35   Ct Head Wo Contrast  05/10/2014   CLINICAL DATA:  Sudden onset difficulty in walking. Decreased level consciousness.  EXAM: CT HEAD WITHOUT CONTRAST  TECHNIQUE: Contiguous axial images were obtained from the base of the skull through the vertex without intravenous contrast.  COMPARISON:  Head CT 05/09/2014  FINDINGS: No acute intracranial hemorrhage. No focal mass lesion. No CT evidence of acute infarction. No midline shift or mass effect. No hydrocephalus. Basilar cisterns are patent.  Mastoid air cells are clear. There is fluid in the sphenoid sinus. Frontal sinuses are clear.  IMPRESSION: 1. No acute intracranial findings.  No change from prior. 2. Chronic inflammation of the sphenoid sinuses   Electronically Signed   By: Genevive BiStewart  Edmunds M.D.   On: 05/10/2014 20:45   Ct Head Wo Contrast  05/09/2014   CLINICAL DATA:  Week and slurred speech.  EXAM: CT HEAD WITHOUT CONTRAST  TECHNIQUE: Contiguous axial images were obtained from the base of the skull through the vertex without intravenous contrast.  COMPARISON:  None.  FINDINGS: Ventricles and sulci are appropriate for patient's age. No evidence for acute cortically based infarct, intracranial hemorrhage, mass lesion or mass effect. Periventricular subcortical white matter hypodensity compatible with chronic small vessel ischemic change. Orbits are unremarkable. Mastoid air cells are well  aerated. Mucosal thickening involving sphenoid sinus ethmoid air cells. Calvarium is intact.  IMPRESSION: No acute intracranial process.  Sphenoid sinus and ethmoid air cell mucosal thickening.   Electronically Signed   By: Annia Beltrew  Davis M.D.   On: 05/09/2014 17:39   Ct Chest W Contrast  05/09/2014   CLINICAL DATA:  Bilateral lower leg weakness.  Slurred speech.  EXAM: CT CHEST WITH CONTRAST  TECHNIQUE: Multidetector CT imaging of the chest was performed during intravenous contrast administration.  CONTRAST:  80mL OMNIPAQUE IOHEXOL 300 MG/ML  SOLN  COMPARISON:  Earlier same date chest radiograph  FINDINGS: Visualized neck base is unremarkable. No enlarged axillary lymphadenopathy. Prominent right hilar lymph node measuring 0.8 cm. Normal heart size. No pericardial effusion. Calcified atherosclerotic plaque involving the coronary arteries.  Central airways are patent. There are tree-in-bud nodular opacities and associated ground-glass opacities within the subpleural right lower lobe. There are additional scattered tree-in-bud opacities within the peripheral right middle lobe, right upper lobe, left upper lobe and left lower lobes. No pleural effusion or pneumothorax.  Visualization of the upper abdomen demonstrates fatty deposition adjacent to the falciform ligament. Post cholecystectomy. Bilateral low-attenuation renal lesions, too small to accurately characterize.  Mid thoracic spine degenerative change.  IMPRESSION: Right lower lobe consolidative opacity with associated tree-in-bud nodular opacities within the right lower, left lower, left upper, right upper and right middle lobes. Overall findings are suggestive of an airway disseminated infectious process. Recommend short term radiographic followup to ensure resolution.  Prominent right hilar lymph node, likely reactive in etiology.   Electronically Signed   By: Annia Beltrew  Davis M.D.   On: 05/09/2014 20:27    Scheduled Meds: . ampicillin-sulbactam (UNASYN) IV   3 g Intravenous Q6H  . atorvastatin  10 mg Oral Daily  . azithromycin  500 mg Intravenous Q24H  . carbidopa-levodopa  3 tablet Oral 4 times per day  . [START ON 05/12/2014] feeding supplement (ENSURE COMPLETE)  237 mL Oral Q24H  . feeding supplement (RESOURCE BREEZE)  1 Container Oral BID BM  . levETIRAcetam  500 mg Intravenous Q12H  . levothyroxine  100 mcg Oral QAC breakfast   Continuous Infusions:     Time spent:35 minutes    Octavion Mollenkopf  [facility] Pager [phone] 7PM-7AM, please contact night-coverage at [email or web address], password [email or web address] 05/11/2014, 2:13 PM  LOS: 2 days

## 2014-05-11 NOTE — Procedures (Signed)
Objective Swallowing Evaluation: Modified Barium Swallowing Study  Patient Details  Name: Angel Bass MRN: [id number] Date of Birth: 08/03/1945  Today's Date: 05/11/2014 Time: 1540-1610 SLP Time Calculation (min) (ACUTE ONLY): 30 min  Past Medical History:  Past Medical History  Diagnosis Date  . Parkinson disease   . Thyroid disease   . UTI (urinary tract infection)   . Fractured elbow   . Hypothyroidism   . Anxiety   . Shortness of breath     with exertion  . Constipation   . Arthritis   . Macular degeneration disease    Past Surgical History:  Past Surgical History  Procedure Laterality Date  . Left elbow surgery    . Trigger finger x 5       thumb- both,   . Carpel tunnel Right   . Cholecystectomy    . Shoulder surgery for spur removal Right   . Open reduction internal fixation (orif) distal radial fracture Right 03/05/2013    Procedure: OPEN REDUCTION INTERNAL FIXATION (ORIF) RIGHT DISTAL RADIUS FRACTURE;  Surgeon: Cheral AlmasNaiping Michael Xu, MD;  Location: [facility] OR;  Service: Orthopedics;  Laterality: Right;   HPI:  68 yo female with PMH of parkinson's presents with brief episode of aphasia, also noted to have recent cough and CT chest concerning for infection (Right lower lobe consolidative opacity with associated tree-in-bud.  Concern for aspiration present given pt's neurological diagnosis and MBS recommended.  Pt reports weight loss due to lack of appetitie and problems choking at times on food more than drink.  She states prior to Parkinson's diagnosis, her dysphagia was worse than current.      Assessment / Plan / Recommendation Clinical Impression  Dysphagia Diagnosis: Moderate oral phase dysphagia;Mild pharyngeal phase dysphagia;Moderate cervical esophageal phase dysphagia   Clinical impression: Moderate oral, mild pharyngeal and moderate cervical esophageal dysphagia with sensorimotor impairments. Delayed oral transiting and oral residuals after swallow noted due to  decreased oral coordination/control/strength.  Pharyngeal swallow marked by decreased epiglottic deflection, sluggish recoil but pt noted to conduct breathhold prior to swallow of liquids which is likely self created compensation strategy to prevent aspiration.  No aspiration noted and trace penetration cleared adequately.    Pt demonstrated decreased UES opening ? Timing vs incomplete opening.  Due to UES deficit, barium tablet given with pudding appeared to lodge below UES with backflow appearing into pharynx.  It was  difficult to view as pt moving rapidly forward stating she needed to gag.  Further liquid swallows aided clearance     Of note, pt reported sensation of food lodging in pharynx when it was clear and pt appeared with only minimal amount of residuals in distal esophagus.  Liquids aided clearance.  Recommend caution with solids requiring significant mastication, meats and pills due to pt's aspiration risk.  Using verbal/visual feedback and teach back reinforced effective compensation strategies.  SLP to follow up for family/pt education re: aspiration mitigation strategies.    Please note, pt did report occasional issues with sensing backflow of intake into pharynx during meals - advised her to inform MD. Also suspect pt's posture may contribute to these symptoms.      Treatment Recommendation  Therapy as outlined in treatment plan below    Diet Recommendation Regular;Thin liquid (groune meats/extra gravy/sauce)   Liquid Administration via: Cup;Straw Medication Administration:  (defer to pt, if problematic crush with applesauce) Supervision: Patient able to self feed Compensations:  (start meal with liquids, intermittent dry swallow, follow sollids with drinks) Postural  Changes and/or Swallow Maneuvers: Seated upright 90 degrees;Upright 30-60 min after meal    Other  Recommendations Oral Care Recommendations: Oral care BID   Follow Up Recommendations   (tbd)    Frequency and  Duration min 2x/week  2 weeks     General HPI: 68 yo female with PMH of parkinson's presents with brief episode of aphasia, also noted to have recent cough and CT chest concerning for infection (Right lower lobe consolidative opacity with associated tree-in-bud.  Concern for aspiration present given pt's neurological diagnosis and MBS recommended.  Pt reports weight loss due to lack of appetitie and problems choking at times on food more than drink.  She states prior to Parkinson's diagnosis, her dysphagia was worse than current.  Type of Study: Modified Barium Swallowing Study Reason for Referral: Objectively evaluate swallowing function Previous Swallow Assessment: reported possible MBS "years and years ago" Diet Prior to this Study: Regular;Thin liquids Respiratory Status: Room air History of Recent Intubation: No Behavior/Cognition: Alert;Cooperative;Pleasant mood Oral Cavity - Dentition: Dentures, top;Missing dentition (missing bottom dentition- pt reports she does not use lower dentures due to ill fitting) Oral Motor / Sensory Function: Impaired - see Bedside swallow eval Self-Feeding Abilities: Able to feed self Patient Positioning: Upright in chair Baseline Vocal Quality: Low vocal intensity (glottal stops, pitch breaks) Volitional Cough: Strong Volitional Swallow: Able to elicit Anatomy: Within functional limits Pharyngeal Secretions: Not observed secondary MBS    Reason for Referral Objectively evaluate swallowing function   Oral Phase Oral Preparation/Oral Phase Oral Phase: Impaired Oral - Nectar Oral - Nectar Teaspoon: Reduced posterior propulsion;Lingual/palatal residue;Delayed oral transit;Lingual pumping Oral - Nectar Cup: Delayed oral transit;Lingual pumping Oral - Thin Oral - Thin Cup: Reduced posterior propulsion;Lingual/palatal residue;Delayed oral transit;Lingual pumping Oral - Thin Straw: Delayed oral transit;Lingual pumping Oral - Solids Oral - Puree: Reduced  posterior propulsion;Lingual/palatal residue;Delayed oral transit;Lingual pumping Oral - Mechanical Soft: Reduced posterior propulsion;Lingual/palatal residue;Delayed oral transit;Lingual pumping;Impaired mastication (exacerbated by pt's lack of lower dentures) Oral - Pill: Reduced posterior propulsion;Delayed oral transit;Lingual pumping Oral Phase - Comment Oral Phase - Comment: pt benefited from occasional cue to conduct dry swallow   Pharyngeal Phase Pharyngeal Phase Pharyngeal Phase: Impaired Pharyngeal - Nectar Pharyngeal - Nectar Teaspoon: Reduced airway/laryngeal closure;Reduced laryngeal elevation;Reduced epiglottic inversion Pharyngeal - Nectar Cup: Reduced laryngeal elevation;Reduced airway/laryngeal closure Pharyngeal - Thin Pharyngeal - Thin Teaspoon: Reduced laryngeal elevation;Reduced airway/laryngeal closure;Reduced epiglottic inversion Pharyngeal - Thin Cup: Reduced airway/laryngeal closure;Reduced laryngeal elevation;Reduced epiglottic inversion;Penetration/Aspiration during swallow Penetration/Aspiration details (thin cup): Material enters airway, remains ABOVE vocal cords then ejected out Pharyngeal - Thin Straw: Reduced epiglottic inversion;Reduced pharyngeal peristalsis;Reduced laryngeal elevation;Reduced airway/laryngeal closure Pharyngeal - Solids Pharyngeal - Puree: Within functional limits Pharyngeal - Mechanical Soft: Within functional limits Pharyngeal - Pill: Pharyngeal residue - cp segment  Cervical Esophageal Phase    GO    Cervical Esophageal Phase Cervical Esophageal Phase: Impaired Cervical Esophageal Phase - Nectar Nectar Teaspoon: Reduced cricopharyngeal relaxation Nectar Cup: Reduced cricopharyngeal relaxation Cervical Esophageal Phase - Thin Thin Teaspoon: Reduced cricopharyngeal relaxation Thin Cup: Reduced cricopharyngeal relaxation Thin Straw: Reduced cricopharyngeal relaxation Cervical Esophageal Phase - Solids Puree: Reduced  cricopharyngeal relaxation Mechanical Soft: Reduced cricopharyngeal relaxation Pill: Reduced cricopharyngeal relaxation Cervical Esophageal Phase - Comment Cervical Esophageal Comment: barium tablet given with pudding appeared to lodge at proximal esophagus with backflow appearing into pharynx - was difficult to view as pt moving rapidly forward stating she needed to gag, further liquid swallows aided clearance  Luanna Salk, [facility] SLP [phone]

## 2014-05-11 NOTE — Progress Notes (Signed)
INITIAL NUTRITION ASSESSMENT  DOCUMENTATION CODES Per approved criteria  -Severe malnutrition in the context of acute illness or injury  Pt meets criteria for severe MALNUTRITION in the context of acute illness as evidenced by 10% weight loss in <3 months and <50% po for >5 days.  INTERVENTION: - Resource Breeze po BID, each supplement provides 250 kcal and 9 grams of protein - Ensure Complete po once daily, each supplement provides 350 kcal and 13 grams of protein  NUTRITION DIAGNOSIS: Inadequate oral intake related to poor appetite as evidenced by poor po intake.   Goal: Pt to meet >/= 90% of their estimated nutrition needs   Monitor:  Weight trend, acceptance of supplements, labs  Reason for Assessment: MST  68 y.o. female  Admitting Dx: <principal problem not specified>  ASSESSMENT: 68 y.o. female has a past medical history of Parkinson disease; Thyroid disease; UTI (urinary tract infection); Fractured elbow; Hypothyroidism; Anxiety; Shortness of breath; Constipation; Arthritis; and Macular degeneration disease. Admitted with shortness of breath and poor appetite.   - Pt reports a 16 lb wt loss in the past 5 weeks which is significant for time frame. Pt has had poor po for the past several weeks. Today she ate 1/2 of a banana, 1/3 of an ensure, several bites of pudding, an egg and toast.  - Pt scheduled for MBS today, but deferred until tomorrow am. Per MD note, pt with mild dysphagia.  - Pt with no signs of fat or muscle wasting.   Height: Ht Readings from Last 1 Encounters:  05/09/14 5\' 6"  (1.676 m)    Weight: Wt Readings from Last 1 Encounters:  05/09/14 144 lb 13.5 oz (65.7 kg)    Ideal Body Weight: 57 kg  % Ideal Body Weight: 115%  Wt Readings from Last 10 Encounters:  05/09/14 144 lb 13.5 oz (65.7 kg)  02/27/13 150 lb (68.04 kg)    Usual Body Weight: 160 lbs  % Usual Body Weight: 90%  BMI:  Body mass index is 23.39 kg/(m^2).  Estimated Nutritional  Needs: Kcal: 1800-2000 Protein: 90-100 g Fluid: 1.8-2.0 L/day  Skin: Intact  Diet Order: Diet regular  EDUCATION NEEDS: -Education needs addressed   Intake/Output Summary (Last 24 hours) at 05/11/14 1337 Last data filed at 05/11/14 1200  Gross per 24 hour  Intake    240 ml  Output   1400 ml  Net  -1160 ml    Last BM: prior to admission   Labs:   Recent Labs Lab 05/09/14 1635 05/10/14 0605  NA 136* 138  K 3.6* 3.9  CL 97 104  CO2 25 25  BUN 11 7  CREATININE 0.45* 0.56  CALCIUM 8.9 8.1*  GLUCOSE 115* 117*    CBG (last 3)   Recent Labs  05/10/14 2004  GLUCAP 126*    Scheduled Meds: . ampicillin-sulbactam (UNASYN) IV  3 g Intravenous Q6H  . atorvastatin  10 mg Oral Daily  . azithromycin  500 mg Intravenous Q24H  . carbidopa-levodopa  3 tablet Oral 4 times per day  . feeding supplement (ENSURE COMPLETE)  237 mL Oral BID BM  . levETIRAcetam  500 mg Intravenous Q12H  . levothyroxine  100 mcg Oral QAC breakfast    Continuous Infusions:   Past Medical History  Diagnosis Date  . Parkinson disease   . Thyroid disease   . UTI (urinary tract infection)   . Fractured elbow   . Hypothyroidism   . Anxiety   . Shortness of breath  with exertion  . Constipation   . Arthritis   . Macular degeneration disease     Past Surgical History  Procedure Laterality Date  . Left elbow surgery    . Trigger finger x 5       thumb- both,   . Carpel tunnel Right   . Cholecystectomy    . Shoulder surgery for spur removal Right   . Open reduction internal fixation (orif) distal radial fracture Right 03/05/2013    Procedure: OPEN REDUCTION INTERNAL FIXATION (ORIF) RIGHT DISTAL RADIUS FRACTURE;  Surgeon: Cheral AlmasNaiping Michael Xu, MD;  Location: [facility] OR;  Service: Orthopedics;  Laterality: Right;    Emmaline KluverHaley Aulani Shipton MS, RD, LDN

## 2014-05-11 NOTE — Procedures (Signed)
ELECTROENCEPHALOGRAM REPORT  Patient: Angel BarterSusan Bass       Room #: 1432 EEG No. ID: [id number] Age: 68 y.o.        Sex: female Referring Physician: Dhungel  Report Date:  05/11/2014        Interpreting Physician: Aline BrochureSTEWART,Jamelah Sitzer R  History: Angel BarterSusan Gellerman is an 68 y.o. female with a history of Parkinson's disease and hypothyroidism presenting with new onset seizure activity.  Indications for study:  New onset seizure activity.  Technique: This is an 18 channel routine scalp EEG performed at the bedside with bipolar and monopolar montages arranged in accordance to the international 10/20 system of electrode placement.   Description: This EEG recording was performed during wakefulness and during drowsiness. Predominant background activity during wakefulness consisted of 9 Hz symmetrical alpha rhythm which attenuated well with eye-opening. Photic stimulation and hyperventilation were not performed. During drowsiness there was mild slowing of background activity diffusely and symmetrically. Stage II of sleep was not achieved. No epileptiform discharges were recorded. There was no abnormal slowing of cerebral activity.  Interpretation: This is a normal EEG recording during wakefulness and during drowsiness. Although study did not show up form activity, seizure disorder cannot be ruled out a with normal interictal EEG recording.   Venetia MaxonR Laiza Veenstra M.D. [facility] [phone]

## 2014-05-12 DIAGNOSIS — B379 Candidiasis, unspecified: Secondary | ICD-10-CM | POA: Diagnosis present

## 2014-05-12 DIAGNOSIS — E43 Unspecified severe protein-calorie malnutrition: Secondary | ICD-10-CM

## 2014-05-12 DIAGNOSIS — E870 Hyperosmolality and hypernatremia: Secondary | ICD-10-CM

## 2014-05-12 LAB — RESPIRATORY VIRUS PANEL
Adenovirus: NOT DETECTED
Influenza A H1: NOT DETECTED
Influenza A H3: NOT DETECTED
Influenza A: NOT DETECTED
Influenza B: NOT DETECTED
Metapneumovirus: NOT DETECTED
PARAINFLUENZA 1 A: NOT DETECTED
Parainfluenza 2: NOT DETECTED
Parainfluenza 3: NOT DETECTED
RESPIRATORY SYNCYTIAL VIRUS A: NOT DETECTED
RESPIRATORY SYNCYTIAL VIRUS B: NOT DETECTED
RHINOVIRUS: NOT DETECTED

## 2014-05-12 LAB — BASIC METABOLIC PANEL
ANION GAP: 9 (ref 5–15)
BUN: 6 mg/dL (ref 6–23)
CHLORIDE: 103 meq/L (ref 96–112)
CO2: 28 mmol/L (ref 19–32)
CREATININE: 0.51 mg/dL (ref 0.50–1.10)
Calcium: 8.3 mg/dL — ABNORMAL LOW (ref 8.4–10.5)
GFR calc Af Amer: >90 mL/min (ref >90)
GFR calc non Af Amer: >90 mL/min (ref >90)
GLUCOSE: 100 mg/dL — AB (ref 70–99)
Potassium: 3.5 mmol/L (ref 3.5–5.1)
Sodium: 140 mmol/L (ref 135–145)

## 2014-05-12 LAB — CBC
HEMATOCRIT: 26 % — AB (ref 36.0–46.0)
Hemoglobin: 8.6 g/dL — ABNORMAL LOW (ref 12.0–15.0)
MCH: 31 pg (ref 26.0–34.0)
MCHC: 33.1 g/dL (ref 30.0–36.0)
MCV: 93.9 fL (ref 78.0–100.0)
Platelets: 439 10*3/uL — ABNORMAL HIGH (ref 150–400)
RBC: 2.77 MIL/uL — ABNORMAL LOW (ref 3.87–5.11)
RDW: 13.5 % (ref 11.5–15.5)
WBC: 3.8 10*3/uL — AB (ref 4.0–10.5)

## 2014-05-12 LAB — LEGIONELLA ANTIGEN, URINE

## 2014-05-12 LAB — CULTURE, RESPIRATORY W GRAM STAIN

## 2014-05-12 LAB — CULTURE, RESPIRATORY

## 2014-05-12 MED ORDER — BOOST / RESOURCE BREEZE PO LIQD: 1.0000 | Freq: Two times a day (BID) | ORAL | Status: AC

## 2014-05-12 MED ORDER — ONDANSETRON HCL 4 MG PO TABS: 4.0000 mg | ORAL_TABLET | Freq: Three times a day (TID) | ORAL | Status: AC | PRN

## 2014-05-12 MED ORDER — ENSURE COMPLETE PO LIQD: 237.0000 mL | ORAL | Status: AC

## 2014-05-12 MED ORDER — LEVOTHYROXINE SODIUM 75 MCG PO TABS
75.0000 ug | ORAL_TABLET | Freq: Every day | ORAL | Status: DC
  Administered 2014-05-12: 75 ug via ORAL
  Filled 2014-05-12 (×2): qty 1

## 2014-05-12 MED ORDER — ONDANSETRON HCL 4 MG/2ML IJ SOLN
4.0000 mg | Freq: Once | INTRAMUSCULAR | Status: AC
  Administered 2014-05-12: 4 mg via INTRAVENOUS
  Filled 2014-05-12: qty 2

## 2014-05-12 MED ORDER — AMOXICILLIN-POT CLAVULANATE 875-125 MG PO TABS: 1.0000 | ORAL_TABLET | Freq: Two times a day (BID) | ORAL | Status: AC

## 2014-05-12 MED ORDER — LEVETIRACETAM 500 MG PO TABS: 500.0000 mg | ORAL_TABLET | Freq: Two times a day (BID) | ORAL | Status: AC

## 2014-05-12 MED ORDER — AZITHROMYCIN 500 MG PO TABS
500.0000 mg | ORAL_TABLET | Freq: Every day | ORAL | Status: DC
  Filled 2014-05-12: qty 1

## 2014-05-12 MED ORDER — FLUCONAZOLE 100 MG PO TABS: 100.0000 mg | ORAL_TABLET | Freq: Every day | ORAL | Status: AC

## 2014-05-12 MED ORDER — LEVOTHYROXINE SODIUM 75 MCG PO TABS: 75.0000 ug | ORAL_TABLET | Freq: Every day | ORAL | Status: AC

## 2014-05-12 NOTE — Progress Notes (Signed)
Speech Language Pathology Treatment: Dysphagia  Patient Details Name: Angel Bass MRN: [id number] DOB: 27-Aug-1945 Today's Date: 05/12/2014 Time: 4917-9150 SLP Time Calculation (min) (ACUTE ONLY): 29 min  Assessment / Plan / Recommendation Clinical Impression  Using diagram of anatomy of swallowing, SLP provided education to pt and her daughter re: findings of MBS and compensation strategies to mitigate aspiration risk.  Pt reports h/o frequent choking "a long time ago", therefore chronic mild dysphagia present.    Reviewed in detail MBS and clinical reasoning for strategies and provided information in writing.  Pt with low grade fever today with good intake documented last pm.    Requested pt again inform MD re: her episodic backflow into pharynx when eating causing her to choke.  As all education completed, SLP to sign off.      HPI HPI: 68 yo female with PMH of parkinson's presents with brief episode of aphasia, also noted to have recent cough and CT chest concerning for infection (Right lower lobe consolidative opacity with associated tree-in-bud.  Concern for aspiration present given pt's neurological diagnosis and MBS recommended.  Pt reports weight loss due to lack of appetitie and problems choking at times on food more than drink.  She states prior to Parkinson's diagnosis, her dysphagia was worse than current.    Pertinent Vitals Pain Assessment: No/denies pain  SLP Plan  All goals met    Recommendations Diet recommendations: Regular;Thin liquid Liquids provided via: Cup;Straw Medication Administration:  (as tolerated) Supervision: Patient able to self feed Compensations: Slow rate (start meal with liquids, intermittent dry swallow, follow solids with liquids) Postural Changes and/or Swallow Maneuvers: Seated upright 90 degrees;Upright 30-60 min after meal              Oral Care Recommendations: Oral care BID Follow up Recommendations: None Plan: All goals met    [place], [place] [facility] [place]

## 2014-05-12 NOTE — Discharge Summary (Addendum)
Physician Discharge Summary  Angel Bass ZOX:[id number] DOB: 1945-08-27 DOA: 05/09/2014  PCP: Dr Bernerd Pho in [place] , [place] date: 05/09/2014 Discharge date: 05/12/2014  Time spent: 35 minutes  Recommendations for Outpatient Follow-up:  1. Discharge home with PCP follow-up in 4 weeks once patient returns to [place]. Antibiotics will be completed on 12/26. 2. -Follow-up chest x-ray in 4-6 weeks to evaluate resolution of pneumonia 3. Please refer patient to outpatient neurology. 4.  Patient is recommended not to drive any vehicle , operate heavy machinery,or swim until cleared by PCP and neurology as outpatient  Discharge summary will be faxed to PCP in [place].  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  Discharge Condition: Fair  Diet recommendation:  Regular diet with thin liquids. Patient instructed to eat slowly, start meal with liquid, intermittent dry swallow, follow solids with liquids. Also instructed to seek upright 90 while eating and sit upright 30-60 minutes after meals.  Filed Weights   05/09/14 2327  Weight: 65.7 kg (144 lb 13.5 oz)    History of present illness:  Please refer to admission H&P for details, but in brief,68 year old female with history of Parkinson disease, hypothyroidism, arthritis and macular degeneration visiting [place] from [place] presented with 2-3 weeks of cough with productive thick brown-green sputum. She also had episode of nausea and vomiting. Reports poor appetite with weight loss. She has a history of smoking which she quit 6 weeks back. [place] the airport to the [place] she had transient slurred speech with an episode of light flashing in her eyes. Patient in the ED  had a normal head CT but a chest CT showed right lower lobe consolidation associated with tree-in-bud nodular opacity in multiple lobes. Patient was also found to have UTI . She has been treated with Bactrim by her primary doctor for the past 3 days. Patient admitted to telemetry for further workup. On 12/20 patient had an acute onset of witnessed tonic-clonic seizures during the night. A stat head CT was unremarkable. Patient was loaded with IV Keppra and ordered for MRI brain and EEG. Neurology was consulted.  Hospital Course:  Aspiration pneumonia Patient has a history of aspiration with several episodes of choking of food in the past. Clinical picture and CT findings suggest clear aspiration. Blood cultures have been negative. Sputum culture growing Candida albicans. AFP and respiratory viral panel negative. Urine strep antigen negative. Urine Legionella antigen pending. -Patient was started on empiric Unasyn and azithromycin. Seen by pulmonary consult and given clinical picture of aspiration antibiotic was switched to oral Augmentin with plan to treat for another 5 days. Sputum culture was positive for moderate Candida albicans. Will treat with a 10 day course of oral fluconazole. -Clinically improved. She remains afebrile. Patient will need a follow-up chest x-ray in 4-6 weeks to see resolution of the pneumonia. -Patient had modified barium swallow done suggestive of moderate oral phase dysphagia. Given clear instructions on aspiration precautions with meals.  Acute seizures Acute episodes of witnessed seizures overnight on 12/20. Head CT unremarkable. Loaded with IV keppra. MRI of the brain without and with contrast done without any acute findings or seizure focus.. EEG was negative for seizure activity. Patient switched to oral Keppra 500 mg  twice daily and will continue. She needs outpatient referral to neurology. Since he is going back to [place] in few weeks her PCP will need to make a  referral. -Instructed on seizure precautions. Patient is on tramadol which can reduce seizure threshold so I will discontinue it.  Dysphagia Patient has moderate oral phase, mild pharyngeal phase and moderate esophageal phase dysphagia. This is likely contributing to her aspiration pneumonia. Clinical instructions given regarding her by mouth intake as outlined above. Patient will need referral for outpatient swallow evaluation and follow-up.   Hypothyroidism Patient's TSH is suppressed to 0.34. I have reduced her Synthroid dose to 75 g (was taking 100 g daily)  Parkinson's disease Continue home medications  Anemia No baseline none. Follow-up as outpatient  UTI Negative urine culture. On augmentin now which should cover.  Hypokalemia Replenished  Severe protein calorie malnutrition Seen by nutrition consult and added supplements   Consults: Neurology Pulmonary  Antibiotics  IV Zosyn 12/19  IV azithromycin 12/19--12/21  Unasyn 12/20-12/21  augmentin 12/22--until 12/26   CODE STATUS: DO NOT RESUSCITATE  Family communication: Daughter at bedside    Discharge Exam: Filed Vitals:   05/12/14 0550  BP: 108/51  Pulse: 104  Temp: 99.1 F (37.3 C)  Resp: 20    General: Elderly female in no acute distress HEENT: Pallor present, moist oral mucosa Chest: Right basilar crackles, no rhonchi or wheeze CVS: Normal S1 and S2, no murmurs Abdomen: Soft, nondistended, nontender Extremities: Warm, no edema CNS: Alert and oriented, nonfocal   Discharge Instructions    Current Discharge Medication List    START taking these medications   Details  amoxicillin-clavulanate (AUGMENTIN) 875-125 MG per tablet Take 1 tablet by mouth 2 (two) times daily. Qty: 10 tablet, Refills: 0     feeding supplement, ENSURE COMPLETE, (ENSURE COMPLETE) LIQD Take 237 mLs by mouth daily. Qty: 30 Bottle, Refills: 0     feeding supplement, RESOURCE BREEZE, (RESOURCE BREEZE) LIQD Take  1 Container by mouth 2 (two) times daily between meals. Qty: 60 Container, Refills: 0    fluconazole (DIFLUCAN) 100 MG tablet Take 1 tablet (100 mg total) by mouth daily. Qty: 10 tablet, Refills: 0    levETIRAcetam (KEPPRA) 500 MG tablet Take 1 tablet (500 mg total) by mouth 2 (two) times daily. Qty: 60 tablet, Refills: 3    ondansetron (ZOFRAN) 4 MG tablet Take 1 tablet (4 mg total) by mouth every 8 (eight) hours as needed for nausea or vomiting. Qty: 30 tablet, Refills: 0        CONTINUE these medications which have CHANGED   Details  levothyroxine (SYNTHROID, LEVOTHROID) 75 MCG tablet Take 1 tablet (75 mcg total) by mouth daily before breakfast. Qty: 30 tablet, Refills: 2      CONTINUE these medications which have NOT CHANGED   Details  atorvastatin (LIPITOR) 10 MG tablet Take 10 mg by mouth daily.    calcium carbonate (OS-CAL) 600 MG TABS tablet Take 600 mg by mouth 2 (two) times daily with a meal.    carbidopa-levodopa (SINEMET IR) 25-100 MG per tablet Take 3 tablets by mouth 4 (four) times daily. Take 3 Tablets at 7:30 am, Take 3 Tablets at 11:30 am, Take 3 Tablets at 2:30 pm & Take 3 Tablets at 5:30 pm.    Cyanocobalamin (VITAMIN B 12 PO) Take 1 tablet by mouth daily.    Multiple Vitamins-Minerals (ICAPS PO) Take 1 tablet by mouth daily.    Probiotic Product (PROBIOTIC DAILY PO) Take 1 capsule  by mouth daily.    ALPRAZolam (XANAX) 0.25 MG tablet Take 0.25 mg by mouth at bedtime as needed for sleep.     calcium-vitamin D (OSCAL WITH D) 500-200 MG-UNIT per tablet Take 1 tablet by mouth daily with breakfast. Qty: 42 tablet, Refills: 0    celecoxib (CELEBREX) 200 MG capsule Take 1 capsule (200 mg total) by mouth 2 (two) times daily. Qty: 28 capsule, Refills: 0    docusate sodium (COLACE) 100 MG capsule Take 1 capsule (100 mg total) by mouth 2 (two) times daily as needed for constipation. Qty: 30 capsule, Refills: 0    HYDROcodone-acetaminophen (NORCO/VICODIN) 5-325 MG  per tablet Take 1-2 tablets by mouth every 6 (six) hours as needed for pain. Qty: 90 tablet, Refills: 0    !! OVER THE COUNTER MEDICATION Take 2 tablets by mouth every 4 (four) hours as needed (sinus congestion). [employer] store sinus medication    !! OVER THE COUNTER MEDICATION Take 2 tablets by mouth as needed (for constipation). [employer] laxative        STOP taking these medications     ibuprofen (ADVIL,MOTRIN) 200 MG tablet      sulfamethoxazole-trimethoprim (BACTRIM DS,SEPTRA DS) 800-160 MG per tablet      traMADol (ULTRAM) 50 MG tablet        No Known Allergies Follow-up Information    Please follow up.   Why:  PCP in 4 weeks in [place]       The results of significant diagnostics from this hospitalization (including imaging, microbiology, ancillary and laboratory) are listed below for reference.    Significant Diagnostic Studies: Dg Chest 2 View  05/09/2014   CLINICAL DATA:  Weakness, shortness of breath, cough.  EXAM: CHEST  2 VIEW  COMPARISON:  03/05/2013  FINDINGS: Multiple monitoring leads overlie the patient. Stable cardiac and mediastinal contours given differences in patient rotation. New focal heterogeneous opacity within the right lung base. No pleural effusion or pneumothorax. Mid thoracic spine degenerative change.  IMPRESSION: New focal heterogeneous opacity within the peripheral right lower lung, potentially infectious in etiology. Underlying mass could have a similar appearance. Recommend short term radiographic followup to assess for resolution.   Electronically Signed   By: Lovey Newcomer M.D.   On: 05/09/2014 17:35   Ct Head Wo Contrast  05/10/2014   CLINICAL DATA:  Sudden onset difficulty in walking. Decreased level consciousness.  EXAM: CT HEAD WITHOUT CONTRAST  TECHNIQUE: Contiguous axial images were obtained from the base of the skull through the vertex without intravenous contrast.  COMPARISON:  Head CT 05/09/2014  FINDINGS: No acute intracranial  hemorrhage. No focal mass lesion. No CT evidence of acute infarction. No midline shift or mass effect. No hydrocephalus. Basilar cisterns are patent.  Mastoid air cells are clear. There is fluid in the sphenoid sinus. Frontal sinuses are clear.  IMPRESSION: 1. No acute intracranial findings.  No change from prior. 2. Chronic inflammation of the sphenoid sinuses   Electronically Signed   By: Suzy Bouchard M.D.   On: 05/10/2014 20:45   Ct Head Wo Contrast  05/09/2014   CLINICAL DATA:  Week and slurred speech.  EXAM: CT HEAD WITHOUT CONTRAST  TECHNIQUE: Contiguous axial images were obtained from the base of the skull through the vertex without intravenous contrast.  COMPARISON:  None.  FINDINGS: Ventricles and sulci are appropriate for patient's age. No evidence for acute cortically based infarct, intracranial hemorrhage, mass lesion or mass effect. Periventricular subcortical white matter hypodensity compatible with chronic  small vessel ischemic change. Orbits are unremarkable. Mastoid air cells are well aerated. Mucosal thickening involving sphenoid sinus ethmoid air cells. Calvarium is intact.  IMPRESSION: No acute intracranial process.  Sphenoid sinus and ethmoid air cell mucosal thickening.   Electronically Signed   By: Lovey Newcomer M.D.   On: 05/09/2014 17:39   Ct Chest W Contrast  05/09/2014   CLINICAL DATA:  Bilateral lower leg weakness.  Slurred speech.  EXAM: CT CHEST WITH CONTRAST  TECHNIQUE: Multidetector CT imaging of the chest was performed during intravenous contrast administration.  CONTRAST:  83m OMNIPAQUE IOHEXOL 300 MG/ML  SOLN  COMPARISON:  Earlier same date chest radiograph  FINDINGS: Visualized neck base is unremarkable. No enlarged axillary lymphadenopathy. Prominent right hilar lymph node measuring 0.8 cm. Normal heart size. No pericardial effusion. Calcified atherosclerotic plaque involving the coronary arteries.  Central airways are patent. There are tree-in-bud nodular opacities  and associated ground-glass opacities within the subpleural right lower lobe. There are additional scattered tree-in-bud opacities within the peripheral right middle lobe, right upper lobe, left upper lobe and left lower lobes. No pleural effusion or pneumothorax.  Visualization of the upper abdomen demonstrates fatty deposition adjacent to the falciform ligament. Post cholecystectomy. Bilateral low-attenuation renal lesions, too small to accurately characterize.  Mid thoracic spine degenerative change.  IMPRESSION: Right lower lobe consolidative opacity with associated tree-in-bud nodular opacities within the right lower, left lower, left upper, right upper and right middle lobes. Overall findings are suggestive of an airway disseminated infectious process. Recommend short term radiographic followup to ensure resolution.  Prominent right hilar lymph node, likely reactive in etiology.   Electronically Signed   By: DLovey NewcomerM.D.   On: 05/09/2014 20:27   Mr BJeri CosWMAContrast  05/11/2014   CLINICAL DATA:  68year old female with Parkinson's disease presenting with seizure. Transient slurred speech and episode of flashing lights in her eyes. Subsequent encounter.  EXAM: MRI HEAD WITHOUT AND WITH CONTRAST  TECHNIQUE: Multiplanar, multiecho pulse sequences of the brain and surrounding structures were obtained without and with intravenous contrast.  CONTRAST:  171mMULTIHANCE GADOBENATE DIMEGLUMINE 529 MG/ML IV SOLN  COMPARISON:  05/10/2014 head CT.  No comparison brain MR.  FINDINGS: Exam is motion degraded.  No acute infarct.  No intracranial hemorrhage.  No evidence of mesial temporal sclerosis.  No intracranial mass or abnormal enhancement.  Mild white matter type changes probably related to result of small vessel disease.  Major intracranial vascular structures are patent with mild ectasia.  Mild atrophy without hydrocephalus.  Prominent mucosal thickening/opacification sphenoid sinus air cells. Minimal  mucosal thickening ethmoid sinus air cells.  Cervical medullary junction, pituitary region, pineal region and orbital structures unremarkable.  IMPRESSION: Exam is motion degraded.  No acute infarct.  No evidence of mesial temporal sclerosis.  No intracranial mass or abnormal enhancement.  Mild white matter type changes probably related to result of small vessel disease.  Mild atrophy without hydrocephalus.  Prominent mucosal thickening/opacification sphenoid sinus air cells. Minimal mucosal thickening ethmoid sinus air cells.   Electronically Signed   By: StChauncey Cruel.D.   On: 05/11/2014 21:19   Dg Swallowing Func-speech Pathology  05/11/2014   TaMacario GoldsCCC-SLP     05/11/2014  4:38 PM Objective Swallowing Evaluation: Modified Barium Swallowing Study   Patient Details  Name: SuJeaneen CalaRN: [id number] of Birth: [birth year]Today's Date: 05/11/2014 Time: 1540-1610 SLP Time Calculation (min) (ACUTE ONLY): 30 min  Past Medical History:  Past  Medical History  Diagnosis Date  . Parkinson disease   . Thyroid disease   . UTI (urinary tract infection)   . Fractured elbow   . Hypothyroidism   . Anxiety   . Shortness of breath     with exertion  . Constipation   . Arthritis   . Macular degeneration disease    Past Surgical History:  Past Surgical History  Procedure Laterality Date  . Left elbow surgery    . Trigger finger x 5       thumb- both,   . Carpel tunnel Right   . Cholecystectomy    . Shoulder surgery for spur removal Right   . Open reduction internal fixation (orif) distal radial fracture  Right 03/05/2013    Procedure: OPEN REDUCTION INTERNAL FIXATION (ORIF) RIGHT DISTAL  RADIUS FRACTURE;  Surgeon: Marianna Payment, MD;  Location: [facility];  Service: Orthopedics;  Laterality: Right;   HPI:  68 yo female with PMH of parkinson's presents with brief episode  of aphasia, also noted to have recent cough and CT chest  concerning for infection (Right lower lobe consolidative opacity  with associated  tree-in-bud.  Concern for aspiration present  given pt's neurological diagnosis and MBS recommended.  Pt  reports weight loss due to lack of appetitie and problems choking  at times on food more than drink.  She states prior to  Parkinson's diagnosis, her dysphagia was worse than current.      Assessment / Plan / Recommendation Clinical Impression  Dysphagia Diagnosis: Moderate oral phase dysphagia;Mild  pharyngeal phase dysphagia;Moderate cervical esophageal phase  dysphagia   Clinical impression: Moderate oral, mild pharyngeal and moderate  cervical esophageal dysphagia with sensorimotor impairments.  Delayed oral transiting and oral residuals after swallow noted  due to decreased oral coordination/control/strength.  Pharyngeal  swallow marked by decreased epiglottic deflection, sluggish  recoil but pt noted to conduct breathhold prior to swallow of  liquids which is likely self created compensation strategy to  prevent aspiration.  No aspiration noted and trace penetration  cleared adequately.    Pt demonstrated decreased UES opening ? Timing vs incomplete  opening.  Due to UES deficit, barium tablet given with pudding  appeared to lodge below UES with backflow appearing into pharynx.   It was  difficult to view as pt moving rapidly forward stating  she needed to gag.  Further liquid swallows aided clearance     Of note, pt reported sensation of food lodging in pharynx when it  was clear and pt appeared with only minimal amount of residuals  in distal esophagus.  Liquids aided clearance.  Recommend caution  with solids requiring significant mastication, meats and pills  due to pt's aspiration risk.  Using verbal/visual feedback and teach back reinforced effective  compensation strategies.  SLP to follow up for family/pt  education re: aspiration mitigation strategies.    Please note, pt did report occasional issues with sensing  backflow of intake into pharynx during meals - advised her to  inform MD. Also  suspect pt's posture may contribute to these  symptoms.      Treatment Recommendation  Therapy as outlined in treatment plan below    Diet Recommendation Regular;Thin liquid (groune meats/extra  gravy/sauce)   Liquid Administration via: Cup;Straw Medication Administration:  (defer to pt, if problematic crush  with applesauce) Supervision: Patient able to self feed Compensations:  (start meal with liquids, intermittent dry  swallow, follow sollids with drinks) Postural Changes and/or Swallow  Maneuvers: Seated upright 90  degrees;Upright 30-60 min after meal    Other  Recommendations Oral Care Recommendations: Oral care BID   Follow Up Recommendations   (tbd)    Frequency and Duration min 2x/week  2 weeks     General HPI: 68 yo female with PMH of parkinson's presents with  brief episode of aphasia, also noted to have recent cough and CT  chest concerning for infection (Right lower lobe consolidative  opacity with associated tree-in-bud.  Concern for aspiration  present given pt's neurological diagnosis and MBS recommended.   Pt reports weight loss due to lack of appetitie and problems  choking at times on food more than drink.  She states prior to  Parkinson's diagnosis, her dysphagia was worse than current.  Type of Study: Modified Barium Swallowing Study Reason for Referral: Objectively evaluate swallowing function Previous Swallow Assessment: reported possible MBS "years and  years ago" Diet Prior to this Study: Regular;Thin liquids Respiratory Status: Room air History of Recent Intubation: No Behavior/Cognition: Alert;Cooperative;Pleasant mood Oral Cavity - Dentition: Dentures, top;Missing dentition (missing  bottom dentition- pt reports she does not use lower dentures due  to ill fitting) Oral Motor / Sensory Function: Impaired - see Bedside swallow  eval Self-Feeding Abilities: Able to feed self Patient Positioning: Upright in chair Baseline Vocal Quality: Low vocal intensity (glottal stops, pitch  breaks)  Volitional Cough: Strong Volitional Swallow: Able to elicit Anatomy: Within functional limits Pharyngeal Secretions: Not observed secondary MBS    Reason for Referral Objectively evaluate swallowing function   Oral Phase Oral Preparation/Oral Phase Oral Phase: Impaired Oral - Nectar Oral - Nectar Teaspoon: Reduced posterior  propulsion;Lingual/palatal residue;Delayed oral transit;Lingual  pumping Oral - Nectar Cup: Delayed oral transit;Lingual pumping Oral - Thin Oral - Thin Cup: Reduced posterior propulsion;Lingual/palatal  residue;Delayed oral transit;Lingual pumping Oral - Thin Straw: Delayed oral transit;Lingual pumping Oral - Solids Oral - Puree: Reduced posterior propulsion;Lingual/palatal  residue;Delayed oral transit;Lingual pumping Oral - Mechanical Soft: Reduced posterior  propulsion;Lingual/palatal residue;Delayed oral transit;Lingual  pumping;Impaired mastication (exacerbated by pt's lack of lower  dentures) Oral - Pill: Reduced posterior propulsion;Delayed oral  transit;Lingual pumping Oral Phase - Comment Oral Phase - Comment: pt benefited from occasional cue to conduct  dry swallow   Pharyngeal Phase Pharyngeal Phase Pharyngeal Phase: Impaired Pharyngeal - Nectar Pharyngeal - Nectar Teaspoon: Reduced airway/laryngeal  closure;Reduced laryngeal elevation;Reduced epiglottic inversion Pharyngeal - Nectar Cup: Reduced laryngeal elevation;Reduced  airway/laryngeal closure Pharyngeal - Thin Pharyngeal - Thin Teaspoon: Reduced laryngeal elevation;Reduced  airway/laryngeal closure;Reduced epiglottic inversion Pharyngeal - Thin Cup: Reduced airway/laryngeal closure;Reduced  laryngeal elevation;Reduced epiglottic  inversion;Penetration/Aspiration during swallow Penetration/Aspiration details (thin cup): Material enters  airway, remains ABOVE vocal cords then ejected out Pharyngeal - Thin Straw: Reduced epiglottic inversion;Reduced  pharyngeal peristalsis;Reduced laryngeal elevation;Reduced  airway/laryngeal  closure Pharyngeal - Solids Pharyngeal - Puree: Within functional limits Pharyngeal - Mechanical Soft: Within functional limits Pharyngeal - Pill: Pharyngeal residue - cp segment  Cervical Esophageal Phase    GO    Cervical Esophageal Phase Cervical Esophageal Phase: Impaired Cervical Esophageal Phase - Nectar Nectar Teaspoon: Reduced cricopharyngeal relaxation Nectar Cup: Reduced cricopharyngeal relaxation Cervical Esophageal Phase - Thin Thin Teaspoon: Reduced cricopharyngeal relaxation Thin Cup: Reduced cricopharyngeal relaxation Thin Straw: Reduced cricopharyngeal relaxation Cervical Esophageal Phase - Solids Puree: Reduced cricopharyngeal relaxation Mechanical Soft: Reduced cricopharyngeal relaxation Pill: Reduced cricopharyngeal relaxation Cervical Esophageal Phase - Comment Cervical Esophageal Comment: barium tablet given with pudding  appeared to lodge at proximal esophagus with backflow appearing  into  pharynx - was difficult to view as pt moving rapidly forward  stating she needed to gag, further liquid swallows aided  clearance          Luanna Salk, [place] [facility] SLP [phone]     Microbiology: Recent Results (from the past 240 hour(s))  Urine culture     Status: None   Collection Time: 05/09/14  7:08 PM  Result Value Ref Range Status   Specimen Description URINE, CLEAN CATCH  Final   Special Requests NONE  Final   Culture  Setup Time   Final    05/10/2014 13:54 Performed at [place] Performed at [facility]   Final   Culture NO GROWTH Performed at [facility]   Final   Report Status 05/11/2014 FINAL  Final  Culture, expectorated sputum-assessment     Status: None   Collection Time: 05/10/14  6:31 AM  Result Value Ref Range Status   Specimen Description SPUTUM  Final   Special Requests Normal  Final   Sputum evaluation   Final    THIS SPECIMEN IS ACCEPTABLE. RESPIRATORY CULTURE REPORT TO FOLLOW.   Report Status 05/10/2014 FINAL   Final  AFB culture with smear     Status: None (Preliminary result)   Collection Time: 05/10/14  6:31 AM  Result Value Ref Range Status   Specimen Description SPUTUM  Final   Special Requests Immunocompromised  Final   Acid Fast Smear   Final    NO ACID FAST BACILLI SEEN Performed at [facility]    Culture   Final    CULTURE WILL BE EXAMINED FOR 6 WEEKS BEFORE ISSUING A FINAL REPORT Performed at [facility]    Report Status PENDING  Incomplete  Culture, respiratory (NON-Expectorated)     Status: None   Collection Time: 05/10/14  6:31 AM  Result Value Ref Range Status   Specimen Description SPUTUM  Final   Special Requests NONE  Final   Gram Stain   Final    MODERATE WBC PRESENT, PREDOMINANTLY PMN RARE SQUAMOUS EPITHELIAL CELLS PRESENT MODERATE YEAST FEW GRAM NEGATIVE RODS Performed at [facility]    Culture   Final    MODERATE CANDIDA ALBICANS Performed at [facility]    Report Status 05/12/2014 FINAL  Final  Culture, blood (routine x 2)     Status: None (Preliminary result)   Collection Time: 05/10/14  6:49 AM  Result Value Ref Range Status   Specimen Description BLOOD LEFT ARM  Final   Special Requests BOTTLES DRAWN AEROBIC ONLY 10CC BLUE BOTTLE  Final   Culture  Setup Time   Final    05/10/2014 16:37 Performed at [facility]    Culture   Final           BLOOD CULTURE RECEIVED NO GROWTH TO DATE CULTURE WILL BE HELD FOR 5 DAYS BEFORE ISSUING A FINAL NEGATIVE REPORT Performed at [facility]    Report Status PENDING  Incomplete  Culture, blood (routine x 2)     Status: None (Preliminary result)   Collection Time: 05/10/14  6:56 AM  Result Value Ref Range Status   Specimen Description BLOOD LEFT HAND  Final   Special Requests BOTTLES DRAWN AEROBIC ONLY 1CC BLUE BOTTLE  Final   Culture  Setup Time   Final    05/10/2014 16:37 Performed at [place]   Final  BLOOD CULTURE RECEIVED  NO GROWTH TO DATE CULTURE WILL BE HELD FOR 5 DAYS BEFORE ISSUING A FINAL NEGATIVE REPORT Performed at [facility]    Report Status PENDING  Incomplete  Respiratory virus panel (routine influenza)     Status: None   Collection Time: 05/10/14  6:59 AM  Result Value Ref Range Status   Source - RVPAN NOSE  Final   Respiratory Syncytial Virus A NOT DETECTED  Final   Respiratory Syncytial Virus B NOT DETECTED  Final   Influenza A NOT DETECTED  Final   Influenza B NOT DETECTED  Final   Parainfluenza 1 NOT DETECTED  Final   Parainfluenza 2 NOT DETECTED  Final   Parainfluenza 3 NOT DETECTED  Final   Metapneumovirus NOT DETECTED  Final   Rhinovirus NOT DETECTED  Final   Adenovirus NOT DETECTED  Final   Influenza A H1 NOT DETECTED  Final   Influenza A H3 NOT DETECTED  Final    Comment: (NOTE)       Normal Reference Range for each Analyte: NOT DETECTED Testing performed using the Luminex xTAG Respiratory Viral Panel test kit. The analytical performance characteristics of this assay have been determined by [facility].  The modifications have not been cleared or approved by the FDA. This assay has been validated pursuant to the CLIA regulations and is used for clinical purposes. Performed at [facility]: Basic Metabolic Panel:  Recent Labs Lab 05/09/14 1635 05/10/14 0605 05/12/14 0430  NA 136* 138 140  K 3.6* 3.9 3.5  CL 97 104 103  CO2 _0 GLUCOSE 115* 117* 100*  BUN _1 CREATININE 0.45* 0.56 0.51  CALCIUM 8.9 8.1* 8.3*   Liver Function Tests:  Recent Labs Lab 05/09/14 1635 05/10/14 0605  AST <5 <5  ALT <5 <5  ALKPHOS 110 101  BILITOT 0.4 0.3  PROT 6.3 5.5*  ALBUMIN 2.9* 2.5*   No results for input(s): LIPASE, AMYLASE in the last 168 hours. No results for input(s): AMMONIA in the last 168 hours. CBC:  Recent Labs Lab 05/09/14 1635 05/10/14 0605 05/11/14 0500 05/12/14 0825  WBC 3.6* 3.1* 2.8* 3.8*  NEUTROABS  2.5 1.9  --   --   HGB 9.3* 8.3* 8.7* 8.6*  HCT 27.8* 24.6* 26.1* 26.0*  MCV 94.2 95.0 95.3 93.9  PLT 557* 495* 534* 439*   Cardiac Enzymes:  Recent Labs Lab 05/10/14 0006 05/10/14 0606 05/10/14 1224  TROPONINI <0.30 <0.30 <0.30   BNP: BNP (last 3 results) No results for input(s): PROBNP in the last 8760 hours. CBG:  Recent Labs Lab 05/10/14 2004  GLUCAP 126*       Signed:  Louellen Molder  [facility] 05/12/2014, 1:31 PM

## 2014-05-12 NOTE — Progress Notes (Signed)
Subjective: No further seizures. Tolerating Keppra well.   Objective: Current vital signs: BP 108/51 mmHg  Pulse 104  Temp(Src) 99.1 F (37.3 C) (Oral)  Resp 20  Ht '5\' 6"'  (1.676 m)  Wt 65.7 kg (144 lb 13.5 oz)  BMI 23.39 kg/m2  SpO2 94% Vital signs in last 24 hours: Temp:  [98.6 F (37 C)-99.9 F (37.7 C)] 99.1 F (37.3 C) (12/22 0550) Pulse Rate:  [94-104] 104 (12/22 0550) Resp:  [20-22] 20 (12/22 0550) BP: (105-114)/(51-59) 108/51 mmHg (12/22 0550) SpO2:  [94 %-97 %] 94 % (12/22 0550) Weight:  [67.132 kg (148 lb)] 67.132 kg (148 lb) (12/21 1952)  Intake/Output from previous day: 12/21 0701 - 12/22 0700 In: 120 [P.O.:120] Out: 200 [Urine:200] Intake/Output this shift:   Nutritional status: Diet regular  Neurologic Exam: Mental Status: Alert, oriented, thought content appropriate. Speech fluent without evidence of aphasia. Able to follow commands without difficulty. Cranial Nerves: II-Visual fields were normal. III/IV/VI-Pupils were equal and reacted. Extraocular movements were full and conjugate.   V/VII-no facial numbness and no facial weakness. VIII-normal. X-normal speech and symmetrical palatal movement. XII-midline tongue extension Motor: 5/5 bilaterally with normal tone and bulk; no tremor was noted. Sensory: Normal throughout. Deep Tendon Reflexes: Trace to 1+ and symmetric in upper extremities and absent in lower extremities Plantars: Mute bilaterally Cerebellar: Normal finger-to-nose testing.   Lab Results: Basic Metabolic Panel:  Recent Labs Lab 05/09/14 1635 05/10/14 0605 05/12/14 0430  NA 136* 138 140  K 3.6* 3.9 3.5  CL 97 104 103  CO2 '25 25 28  ' GLUCOSE 115* 117* 100*  BUN '11 7 6  ' CREATININE 0.45* 0.56 0.51  CALCIUM 8.9 8.1* 8.3*    Liver Function Tests:  Recent Labs Lab 05/09/14 1635 05/10/14 0605  AST <5 <5  ALT <5 <5  ALKPHOS 110 101  BILITOT 0.4 0.3  PROT 6.3 5.5*  ALBUMIN 2.9* 2.5*   No results for input(s):  LIPASE, AMYLASE in the last 168 hours. No results for input(s): AMMONIA in the last 168 hours.  CBC:  Recent Labs Lab 05/09/14 1635 05/10/14 0605 05/11/14 0500 05/12/14 0825  WBC 3.6* 3.1* 2.8* 3.8*  NEUTROABS 2.5 1.9  --   --   HGB 9.3* 8.3* 8.7* 8.6*  HCT 27.8* 24.6* 26.1* 26.0*  MCV 94.2 95.0 95.3 93.9  PLT 557* 495* 534* 439*    Cardiac Enzymes:  Recent Labs Lab 05/10/14 0006 05/10/14 0606 05/10/14 1224  TROPONINI <0.30 <0.30 <0.30    Lipid Panel: No results for input(s): CHOL, TRIG, HDL, CHOLHDL, VLDL, LDLCALC in the last 168 hours.  CBG:  Recent Labs Lab 05/10/14 2004  GLUCAP 126*    Microbiology: Results for orders placed or performed during the hospital encounter of 05/09/14  Urine culture     Status: None   Collection Time: 05/09/14  7:08 PM  Result Value Ref Range Status   Specimen Description URINE, CLEAN CATCH  Final   Special Requests NONE  Final   Culture  Setup Time   Final    05/10/2014 13:54 Performed at [place] Performed at [facility]   Final   Culture NO GROWTH Performed at [facility]   Final   Report Status 05/11/2014 FINAL  Final  Culture, expectorated sputum-assessment     Status: None   Collection Time: 05/10/14  6:31 AM  Result Value Ref Range Status   Specimen Description SPUTUM  Final   Special  Requests Normal  Final   Sputum evaluation   Final    THIS SPECIMEN IS ACCEPTABLE. RESPIRATORY CULTURE REPORT TO FOLLOW.   Report Status 05/10/2014 FINAL  Final  AFB culture with smear     Status: None (Preliminary result)   Collection Time: 05/10/14  6:31 AM  Result Value Ref Range Status   Specimen Description SPUTUM  Final   Special Requests Immunocompromised  Final   Acid Fast Smear   Final    NO ACID FAST BACILLI SEEN Performed at [facility]    Culture   Final    CULTURE WILL BE EXAMINED FOR 6 WEEKS BEFORE ISSUING A FINAL REPORT Performed at [facility]    Report Status PENDING  Incomplete  Culture, respiratory (NON-Expectorated)     Status: None   Collection Time: 05/10/14  6:31 AM  Result Value Ref Range Status   Specimen Description SPUTUM  Final   Special Requests NONE  Final   Gram Stain   Final    MODERATE WBC PRESENT, PREDOMINANTLY PMN RARE SQUAMOUS EPITHELIAL CELLS PRESENT MODERATE YEAST FEW GRAM NEGATIVE RODS Performed at [facility]    Culture   Final    MODERATE CANDIDA ALBICANS Performed at [facility]    Report Status 05/12/2014 FINAL  Final  Culture, blood (routine x 2)     Status: None (Preliminary result)   Collection Time: 05/10/14  6:49 AM  Result Value Ref Range Status   Specimen Description BLOOD LEFT ARM  Final   Special Requests BOTTLES DRAWN AEROBIC ONLY 10CC BLUE BOTTLE  Final   Culture  Setup Time   Final    05/10/2014 16:37 Performed at [facility]    Culture   Final           BLOOD CULTURE RECEIVED NO GROWTH TO DATE CULTURE WILL BE HELD FOR 5 DAYS BEFORE ISSUING A FINAL NEGATIVE REPORT Performed at [facility]    Report Status PENDING  Incomplete  Culture, blood (routine x 2)     Status: None (Preliminary result)   Collection Time: 05/10/14  6:56 AM  Result Value Ref Range Status   Specimen Description BLOOD LEFT HAND  Final   Special Requests BOTTLES DRAWN AEROBIC ONLY 1CC BLUE BOTTLE  Final   Culture  Setup Time   Final    05/10/2014 16:37 Performed at [facility]    Culture   Final           BLOOD CULTURE RECEIVED NO GROWTH TO DATE CULTURE WILL BE HELD FOR 5 DAYS BEFORE ISSUING A FINAL NEGATIVE REPORT Performed at [facility]    Report Status PENDING  Incomplete  Respiratory virus panel (routine influenza)     Status: None   Collection Time: 05/10/14  6:59 AM  Result Value Ref Range Status   Source - RVPAN NOSE  Final   Respiratory Syncytial Virus A NOT DETECTED  Final   Respiratory Syncytial Virus B NOT DETECTED   Final   Influenza A NOT DETECTED  Final   Influenza B NOT DETECTED  Final   Parainfluenza 1 NOT DETECTED  Final   Parainfluenza 2 NOT DETECTED  Final   Parainfluenza 3 NOT DETECTED  Final   Metapneumovirus NOT DETECTED  Final   Rhinovirus NOT DETECTED  Final   Adenovirus NOT DETECTED  Final   Influenza A H1 NOT DETECTED  Final   Influenza A H3 NOT DETECTED  Final  Comment: (NOTE)       Normal Reference Range for each Analyte: NOT DETECTED Testing performed using the Luminex xTAG Respiratory Viral Panel test kit. The analytical performance characteristics of this assay have been determined by [facility].  The modifications have not been cleared or approved by the FDA. This assay has been validated pursuant to the CLIA regulations and is used for clinical purposes. Performed at [facility]     Coagulation Studies: No results for input(s): LABPROT, INR in the last 72 hours.  Imaging: Ct Head Wo Contrast  05/10/2014   CLINICAL DATA:  Sudden onset difficulty in walking. Decreased level consciousness.  EXAM: CT HEAD WITHOUT CONTRAST  TECHNIQUE: Contiguous axial images were obtained from the base of the skull through the vertex without intravenous contrast.  COMPARISON:  Head CT 05/09/2014  FINDINGS: No acute intracranial hemorrhage. No focal mass lesion. No CT evidence of acute infarction. No midline shift or mass effect. No hydrocephalus. Basilar cisterns are patent.  Mastoid air cells are clear. There is fluid in the sphenoid sinus. Frontal sinuses are clear.  IMPRESSION: 1. No acute intracranial findings.  No change from prior. 2. Chronic inflammation of the sphenoid sinuses   Electronically Signed   By: Suzy Bouchard M.D.   On: 05/10/2014 20:45   Mr Kizzie Fantasia Contrast  05/11/2014   CLINICAL DATA:  68 year old female with Parkinson's disease presenting with seizure. Transient slurred speech and episode of flashing lights in her eyes. Subsequent encounter.  EXAM:  MRI HEAD WITHOUT AND WITH CONTRAST  TECHNIQUE: Multiplanar, multiecho pulse sequences of the brain and surrounding structures were obtained without and with intravenous contrast.  CONTRAST:  15m MULTIHANCE GADOBENATE DIMEGLUMINE 529 MG/ML IV SOLN  COMPARISON:  05/10/2014 head CT.  No comparison brain MR.  FINDINGS: Exam is motion degraded.  No acute infarct.  No intracranial hemorrhage.  No evidence of mesial temporal sclerosis.  No intracranial mass or abnormal enhancement.  Mild white matter type changes probably related to result of small vessel disease.  Major intracranial vascular structures are patent with mild ectasia.  Mild atrophy without hydrocephalus.  Prominent mucosal thickening/opacification sphenoid sinus air cells. Minimal mucosal thickening ethmoid sinus air cells.  Cervical medullary junction, pituitary region, pineal region and orbital structures unremarkable.  IMPRESSION: Exam is motion degraded.  No acute infarct.  No evidence of mesial temporal sclerosis.  No intracranial mass or abnormal enhancement.  Mild white matter type changes probably related to result of small vessel disease.  Mild atrophy without hydrocephalus.  Prominent mucosal thickening/opacification sphenoid sinus air cells. Minimal mucosal thickening ethmoid sinus air cells.   Electronically Signed   By: SChauncey CruelM.D.   On: 05/11/2014 21:19   Dg Swallowing Func-speech Pathology  05/11/2014   TMacario Golds CCC-SLP     05/11/2014  4:38 PM Objective Swallowing Evaluation: Modified Barium Swallowing Study   Patient Details  Name: SLakishia BourassaMRN: [id number] of Birth: 120-Jun-1947 Today's Date: 05/11/2014 Time: 1540-1610 SLP Time Calculation (min) (ACUTE ONLY): 30 min  Past Medical History:  Past Medical History  Diagnosis Date  . Parkinson disease   . Thyroid disease   . UTI (urinary tract infection)   . Fractured elbow   . Hypothyroidism   . Anxiety   . Shortness of breath     with exertion  . Constipation   .  Arthritis   . Macular degeneration disease    Past Surgical History:  Past Surgical History  Procedure Laterality Date  . Left elbow surgery    . Trigger finger x 5       thumb- both,   . Carpel tunnel Right   . Cholecystectomy    . Shoulder surgery for spur removal Right   . Open reduction internal fixation (orif) distal radial fracture  Right 03/05/2013    Procedure: OPEN REDUCTION INTERNAL FIXATION (ORIF) RIGHT DISTAL  RADIUS FRACTURE;  Surgeon: Marianna Payment, MD;  Location: [facility];  Service: Orthopedics;  Laterality: Right;   HPI:  68 yo female with PMH of parkinson's presents with brief episode  of aphasia, also noted to have recent cough and CT chest  concerning for infection (Right lower lobe consolidative opacity  with associated tree-in-bud.  Concern for aspiration present  given pt's neurological diagnosis and MBS recommended.  Pt  reports weight loss due to lack of appetitie and problems choking  at times on food more than drink.  She states prior to  Parkinson's diagnosis, her dysphagia was worse than current.      Assessment / Plan / Recommendation Clinical Impression  Dysphagia Diagnosis: Moderate oral phase dysphagia;Mild  pharyngeal phase dysphagia;Moderate cervical esophageal phase  dysphagia   Clinical impression: Moderate oral, mild pharyngeal and moderate  cervical esophageal dysphagia with sensorimotor impairments.  Delayed oral transiting and oral residuals after swallow noted  due to decreased oral coordination/control/strength.  Pharyngeal  swallow marked by decreased epiglottic deflection, sluggish  recoil but pt noted to conduct breathhold prior to swallow of  liquids which is likely self created compensation strategy to  prevent aspiration.  No aspiration noted and trace penetration  cleared adequately.    Pt demonstrated decreased UES opening ? Timing vs incomplete  opening.  Due to UES deficit, barium tablet given with pudding  appeared to lodge below UES with backflow appearing  into pharynx.   It was  difficult to view as pt moving rapidly forward stating  she needed to gag.  Further liquid swallows aided clearance     Of note, pt reported sensation of food lodging in pharynx when it  was clear and pt appeared with only minimal amount of residuals  in distal esophagus.  Liquids aided clearance.  Recommend caution  with solids requiring significant mastication, meats and pills  due to pt's aspiration risk.  Using verbal/visual feedback and teach back reinforced effective  compensation strategies.  SLP to follow up for family/pt  education re: aspiration mitigation strategies.    Please note, pt did report occasional issues with sensing  backflow of intake into pharynx during meals - advised her to  inform MD. Also suspect pt's posture may contribute to these  symptoms.      Treatment Recommendation  Therapy as outlined in treatment plan below    Diet Recommendation Regular;Thin liquid (groune meats/extra  gravy/sauce)   Liquid Administration via: Cup;Straw Medication Administration:  (defer to pt, if problematic crush  with applesauce) Supervision: Patient able to self feed Compensations:  (start meal with liquids, intermittent dry  swallow, follow sollids with drinks) Postural Changes and/or Swallow Maneuvers: Seated upright 90  degrees;Upright 30-60 min after meal    Other  Recommendations Oral Care Recommendations: Oral care BID   Follow Up Recommendations   (tbd)    Frequency and Duration min 2x/week  2 weeks     General HPI: 68 yo female with PMH of parkinson's presents with  brief episode of aphasia, also noted to have recent cough and CT  chest  concerning for infection (Right lower lobe consolidative  opacity with associated tree-in-bud.  Concern for aspiration  present given pt's neurological diagnosis and MBS recommended.   Pt reports weight loss due to lack of appetitie and problems  choking at times on food more than drink.  She states prior to  Parkinson's diagnosis, her  dysphagia was worse than current.  Type of Study: Modified Barium Swallowing Study Reason for Referral: Objectively evaluate swallowing function Previous Swallow Assessment: reported possible MBS "years and  years ago" Diet Prior to this Study: Regular;Thin liquids Respiratory Status: Room air History of Recent Intubation: No Behavior/Cognition: Alert;Cooperative;Pleasant mood Oral Cavity - Dentition: Dentures, top;Missing dentition (missing  bottom dentition- pt reports she does not use lower dentures due  to ill fitting) Oral Motor / Sensory Function: Impaired - see Bedside swallow  eval Self-Feeding Abilities: Able to feed self Patient Positioning: Upright in chair Baseline Vocal Quality: Low vocal intensity (glottal stops, pitch  breaks) Volitional Cough: Strong Volitional Swallow: Able to elicit Anatomy: Within functional limits Pharyngeal Secretions: Not observed secondary MBS    Reason for Referral Objectively evaluate swallowing function   Oral Phase Oral Preparation/Oral Phase Oral Phase: Impaired Oral - Nectar Oral - Nectar Teaspoon: Reduced posterior  propulsion;Lingual/palatal residue;Delayed oral transit;Lingual  pumping Oral - Nectar Cup: Delayed oral transit;Lingual pumping Oral - Thin Oral - Thin Cup: Reduced posterior propulsion;Lingual/palatal  residue;Delayed oral transit;Lingual pumping Oral - Thin Straw: Delayed oral transit;Lingual pumping Oral - Solids Oral - Puree: Reduced posterior propulsion;Lingual/palatal  residue;Delayed oral transit;Lingual pumping Oral - Mechanical Soft: Reduced posterior  propulsion;Lingual/palatal residue;Delayed oral transit;Lingual  pumping;Impaired mastication (exacerbated by pt's lack of lower  dentures) Oral - Pill: Reduced posterior propulsion;Delayed oral  transit;Lingual pumping Oral Phase - Comment Oral Phase - Comment: pt benefited from occasional cue to conduct  dry swallow   Pharyngeal Phase Pharyngeal Phase Pharyngeal Phase: Impaired Pharyngeal - Nectar  Pharyngeal - Nectar Teaspoon: Reduced airway/laryngeal  closure;Reduced laryngeal elevation;Reduced epiglottic inversion Pharyngeal - Nectar Cup: Reduced laryngeal elevation;Reduced  airway/laryngeal closure Pharyngeal - Thin Pharyngeal - Thin Teaspoon: Reduced laryngeal elevation;Reduced  airway/laryngeal closure;Reduced epiglottic inversion Pharyngeal - Thin Cup: Reduced airway/laryngeal closure;Reduced  laryngeal elevation;Reduced epiglottic  inversion;Penetration/Aspiration during swallow Penetration/Aspiration details (thin cup): Material enters  airway, remains ABOVE vocal cords then ejected out Pharyngeal - Thin Straw: Reduced epiglottic inversion;Reduced  pharyngeal peristalsis;Reduced laryngeal elevation;Reduced  airway/laryngeal closure Pharyngeal - Solids Pharyngeal - Puree: Within functional limits Pharyngeal - Mechanical Soft: Within functional limits Pharyngeal - Pill: Pharyngeal residue - cp segment  Cervical Esophageal Phase    GO    Cervical Esophageal Phase Cervical Esophageal Phase: Impaired Cervical Esophageal Phase - Nectar Nectar Teaspoon: Reduced cricopharyngeal relaxation Nectar Cup: Reduced cricopharyngeal relaxation Cervical Esophageal Phase - Thin Thin Teaspoon: Reduced cricopharyngeal relaxation Thin Cup: Reduced cricopharyngeal relaxation Thin Straw: Reduced cricopharyngeal relaxation Cervical Esophageal Phase - Solids Puree: Reduced cricopharyngeal relaxation Mechanical Soft: Reduced cricopharyngeal relaxation Pill: Reduced cricopharyngeal relaxation Cervical Esophageal Phase - Comment Cervical Esophageal Comment: barium tablet given with pudding  appeared to lodge at proximal esophagus with backflow appearing  into pharynx - was difficult to view as pt moving rapidly forward  stating she needed to gag, further liquid swallows aided  clearance          Luanna Salk, MS [facility] SLP [phone]     Medications:  Prior to Admission:  Prescriptions prior to admission  Medication Sig  Dispense Refill Last Dose  . atorvastatin (LIPITOR) 10 MG tablet Take 10 mg by  mouth daily.   05/09/2014 at Unknown time  . calcium carbonate (OS-CAL) 600 MG TABS tablet Take 600 mg by mouth 2 (two) times daily with a meal.   05/08/2014 at Unknown time  . carbidopa-levodopa (SINEMET IR) 25-100 MG per tablet Take 3 tablets by mouth 4 (four) times daily. Take 3 Tablets at 7:30 am, Take 3 Tablets at 11:30 am, Take 3 Tablets at 2:30 pm & Take 3 Tablets at 5:30 pm.   05/09/2014 at 1430  . Cyanocobalamin (VITAMIN B 12 PO) Take 1 tablet by mouth daily.   05/08/2014 at Unknown time  . ibuprofen (ADVIL,MOTRIN) 200 MG tablet Take 400 mg by mouth daily.    05/08/2014 at Unknown time  . levothyroxine (SYNTHROID, LEVOTHROID) 100 MCG tablet Take 100 mcg by mouth daily before breakfast.   05/09/2014 at Unknown time  . Multiple Vitamins-Minerals (ICAPS PO) Take 1 tablet by mouth daily.   05/08/2014 at Unknown time  . Probiotic Product (PROBIOTIC DAILY PO) Take 1 capsule by mouth daily.   05/09/2014 at Unknown time  . sulfamethoxazole-trimethoprim (BACTRIM DS,SEPTRA DS) 800-160 MG per tablet Take 1 tablet by mouth daily. For 7 Days.   05/09/2014 at Unknown time  . traMADol (ULTRAM) 50 MG tablet Take 50 mg by mouth 3 (three) times daily as needed for moderate pain or severe pain (pain).   Past Week at Unknown time  . ALPRAZolam (XANAX) 0.25 MG tablet Take 0.25 mg by mouth at bedtime as needed for sleep.    unknown at unknown time  . calcium-vitamin D (OSCAL WITH D) 500-200 MG-UNIT per tablet Take 1 tablet by mouth daily with breakfast. (Patient not taking: Reported on 05/09/2014) 42 tablet 0 Completed Course at Unknown time  . celecoxib (CELEBREX) 200 MG capsule Take 1 capsule (200 mg total) by mouth 2 (two) times daily. (Patient not taking: Reported on 05/09/2014) 28 capsule 0 Not Taking at Unknown time  . docusate sodium (COLACE) 100 MG capsule Take 1 capsule (100 mg total) by mouth 2 (two) times daily as needed for  constipation. 30 capsule 0 unknown at unknown time  . HYDROcodone-acetaminophen (NORCO/VICODIN) 5-325 MG per tablet Take 1-2 tablets by mouth every 6 (six) hours as needed for pain. 90 tablet 0 unknown at unknown time  . OVER THE COUNTER MEDICATION Take 2 tablets by mouth every 4 (four) hours as needed (sinus congestion). [employer] sinus medication   unknown at unknown time  . OVER THE COUNTER MEDICATION Take 2 tablets by mouth as needed (for constipation). [employer] laxative   unknown at unknown time   Scheduled: . ampicillin-sulbactam (UNASYN) IV  3 g Intravenous Q6H  . atorvastatin  10 mg Oral Daily  . azithromycin  500 mg Intravenous Q24H  . carbidopa-levodopa  3 tablet Oral 4 times per day  . feeding supplement (ENSURE COMPLETE)  237 mL Oral Q24H  . feeding supplement (RESOURCE BREEZE)  1 Container Oral BID BM  . levETIRAcetam  500 mg Intravenous Q12H  . levothyroxine  75 mcg Oral QAC breakfast    Assessment/Plan:  68 year old lady with history of Parkinson's disease and hypothyroidism resenting with new onset partial seizure activity with secondary generalization. Etiology is unclear. No structural abnormality noted on CT scan of the head or MRI brain. EEG performed earlier today showed no epileptiform activity.  Tolerating Keppra well and no further seizures noted.   Recommend:  1) Continue Keppra 500 mg BID --may switch to PO when taking pills.  2) Will need out  patient neurology follow up.    Neurology will S/O  Etta Quill PA-C [facility] [phone]  05/12/2014, 8:56 AM

## 2014-05-12 NOTE — Progress Notes (Signed)
PHARMACIST - PHYSICIAN COMMUNICATION DR:   Cameron Alihungal CONCERNING: Antibiotic IV to Oral Route Change Policy  RECOMMENDATION: This patient is receiving Zithromax by the intravenous route.  Based on criteria approved by the Pharmacy and Therapeutics Committee, the antibiotic(s) is/are being converted to the equivalent oral dose form(s).   DESCRIPTION: These criteria include:  Patient being treated for a respiratory tract infection, urinary tract infection, cellulitis or clostridium difficile associated diarrhea if on metronidazole  The patient is not neutropenic and does not exhibit a GI malabsorption state  The patient is eating (either orally or via tube) and/or has been taking other orally administered medications for a least 24 hours  The patient is improving clinically and has a Tmax < 100.5  If you have questions about this conversion, please contact the Pharmacy Department  []   [phone]( [phone] )  [facility] []   [phone]( [phone] )  [facility]  []   [phone]( [phone]  [facility] [x]   [phone]( [phone] )  [facility]    Hessie KnowsJustin M Dallan Schonberg, PharmD, BCPS Pager [phone] 05/12/2014 1:00 PM

## 2014-05-12 NOTE — Discharge Instructions (Addendum)
Aspiration Precautions Aspiration is the inhaling of a liquid or object into the lungs. Things that can be inhaled into the lungs include:  Food.  Any type of liquid, such as drinks or saliva.  Stomach contents, such as vomit or stomach acid. When these things go into the lungs, damage can occur. Serious complications can then result, such as:  A lung infection (pneumonia).  A collection of pus in the lungs (lung abscess). CAUSES  A decreased level of awareness (consciousness) due to:  Traumatic brain injury or head injury.  Stroke.  Neurological disease.  Seizures.  Decreased or absent gag reflex (inability to cough).  Medical conditions that affect swallowing.  Conditions that affect the food pipe (esophagus) such as a narrowing of the esophagus (esophageal stricture).  Gastroesophageal reflux (GERD). This is also known as acid reflux.  Any type of surgery where you are put under general anesthesia or have sedation.  Drinking large amounts of alcohol.  Taking medication that causes drowsiness, confusion, or weakness.  Aging.  Dental problems.  Having a feeding tube. SYMPTOMS When aspiration occurs, different signs and symptoms can occur, such as:  Coughing (if a person has a cough or gag reflex) after swallowing food or liquids.  Difficulty breathing. This can include things like:  Breathing rapidly.  Breathing very slowly.  Loud breathing.  Hearing "gurgling" lung sounds when a person breaths.  Coughing up phlegm (sputum) that is:  Yellow, tan, or green in color.  Has pieces of food in it.  Bad smelling.  A change in voice (hoarseness) or a "gurgly" sound to the voice.  A change in skin color. The skin may turn red, or a "bluish" type color because of a lack of oxygen (cyanosis).  Fever.  Eyes watering.  Pain in the chest or back.  Facial grimacing .  A feeling of fullness in the throat or that something is stuck in the  throat. DIAGNOSIS  A chest X-ray may be performed. This takes a picture of your lungs. It can show changes in the lungs if aspiration has occurred.  A bronchoscopy may be performed. This is a surgical procedure in which a thin, flexible tube with a camera at the end is inserted into the nose or mouth. The tube is advanced to the lungs so your health care provider can view the lungs and obtain a culture, tissue sample, or remove an aspirated object.  A swallowing evaluation study may be performed to evaluate:  A person's risk of aspiration.  How difficult it is for a person to swallow.  What types of foods are safe for a person to eat. PREVENTION If you are a caregiver to someone who may aspirate, follow the directions below. If you are caring for someone who can eat and drink through their mouth:  Have them sit in an upright position when eating food or drinking fluids, such as:  Sitting up in a chair.  If sitting in a chair is not possible, position the person in bed so they are upright.  Remind the person to eat slowly and chew well.  Do not distract the person. This is especially important for people with thinking or memory (cognitive) problems.  Check the person's mouth for leftover food after eating.  Keep the person sitting upright for 30 to 45 minutes after eating.  Do not serve food or drink for at least 2 hours before bedtime. If you are caring for someone with a feeding tube and he or she  cannot eat or drink through their mouth:  Keep the person in an upright position as much as possible.  Do not  lay the person flat if they are getting continuous feedings. Turn the feeding pump off if you need to lay the person flat for any reason.  Check feeding tube residuals as directed by your health care provider. If a large amount of tube feedings are pulled back (aspirated) from the feeding tube, call your health care provider right away. General guidelines to prevent  aspiration include:  Feed small amounts of food. Do not force feed.  Use as little water as possible when brushing the person's teeth or cleaning his or her mouth.  Provide oral care before and after meals.  Never put food or fluids in the mouth of a person who is not fully alert.  Crush pills and put them in soft food such as pudding or ice cream. Some pills should not be crushed. Check with your health care provider before crushing any medication. SEEK IMMEDIATE MEDICAL CARE IF:   The person has trouble breathing or starts to breathe rapidly.  The person is breathing very slowly or stops breathing.  The person coughs a lot after eating or drinking.  The person has a chronic cough.  The person coughs up thick, yellow, or tan sputum.  The person has a fever or persistent symptoms for more than 72 hours.  The person has a fever and their symptoms suddenly get worse. Document Released: 06/10/2010 Document Revised: 05/13/2013 Document Reviewed: 08/13/2013 [facility] Patient Information 2015 [place], [place]. This information is not intended to replace advice given to you by your health care provider. Make sure you discuss any questions you have with your health care provider.    Epilepsy Epilepsy is a disorder in which a person has repeated seizures over time. A seizure is a release of abnormal electrical activity in the brain. Seizures can cause a change in attention, behavior, or the ability to remain awake and alert (altered mental status). Seizures often involve uncontrollable shaking (convulsions).  Most people with epilepsy lead normal lives. However, people with epilepsy are at an increased risk of falls, accidents, and injuries. Therefore, it is important to begin treatment right away. CAUSES  Epilepsy has many possible causes. Anything that disturbs the normal pattern of brain cell activity can lead to seizures. This may include:   Head injury.  Birth trauma.  High fever  as a child.  Stroke.  Bleeding into or around the brain.  Certain drugs.  Prolonged low oxygen, such as what occurs after CPR efforts.  Abnormal brain development.  Certain illnesses, such as meningitis, encephalitis (brain infection), malaria, and other infections.  An imbalance of nerve signaling chemicals (neurotransmitters).  SIGNS AND SYMPTOMS  The symptoms of a seizure can vary greatly from one person to another. Right before a seizure, you may have a warning (aura) that a seizure is about to occur. An aura may include the following symptoms:  Fear or anxiety.  Nausea.  Feeling like the room is spinning (vertigo).  Vision changes, such as seeing flashing lights or spots. Common symptoms during a seizure include:  Abnormal sensations, such as an abnormal smell or a bitter taste in the mouth.   Sudden, general body stiffness.   Convulsions that involve rhythmic jerking of the face, arm, or leg on one or both sides.   Sudden change in consciousness.   Appearing to be awake but not responding.   Appearing to be  asleep but cannot be awakened.   Grimacing, chewing, lip smacking, drooling, tongue biting, or loss of bowel or bladder control. After a seizure, you may feel sleepy for a while. DIAGNOSIS  Your health care provider will ask about your symptoms and take a medical history. Descriptions from any witnesses to your seizures will be very helpful in the diagnosis. A physical exam, including a detailed neurological exam, is necessary. Various tests may be done, such as:   An electroencephalogram (EEG). This is a painless test of your brain waves. In this test, a diagram is created of your brain waves. These diagrams can be interpreted by a specialist.  An MRI of the brain.   A CT scan of the brain.   A spinal tap (lumbar puncture, LP).  Blood tests to check for signs of infection or abnormal blood chemistry. TREATMENT  There is no cure for epilepsy,  but it is generally treatable. Once epilepsy is diagnosed, it is important to begin treatment as soon as possible. For most people with epilepsy, seizures can be controlled with medicines. The following may also be used:  A pacemaker for the brain (vagus nerve stimulator) can be used for people with seizures that are not well controlled by medicine.  Surgery on the brain. For some people, epilepsy eventually goes away. HOME CARE INSTRUCTIONS   Follow your health care provider's recommendations on driving and safety in normal activities.  Get enough rest. Lack of sleep can cause seizures.  Only take over-the-counter or prescription medicines as directed by your health care provider. Take any prescribed medicine exactly as directed.  Avoid any known triggers of your seizures.  Keep a seizure diary. Record what you recall about any seizure, especially any possible trigger.   Make sure the people you live and work with know that you are prone to seizures. They should receive instructions on how to help you. In general, a witness to a seizure should:   Cushion your head and body.   Turn you on your side.   Avoid unnecessarily restraining you.   Not place anything inside your mouth.   Call for emergency medical help if there is any question about what has occurred.   Follow up with your health care provider as directed. You may need regular blood tests to monitor the levels of your medicine.  SEEK MEDICAL CARE IF:   You develop signs of infection or other illness. This might increase the risk of a seizure.   You seem to be having more frequent seizures.   Your seizure pattern is changing.  SEEK IMMEDIATE MEDICAL CARE IF:   You have a seizure that does not stop after a few moments.   You have a seizure that causes any difficulty in breathing.   You have a seizure that results in a very severe headache.   You have a seizure that leaves you with the inability to  speak or use a part of your body.  Document Released: 05/08/2005 Document Revised: 02/26/2013 Document Reviewed: 12/18/2012 [facility] Patient Information 2015 [place], [place]. This information is not intended to replace advice given to you by your health care provider. Make sure you discuss any questions you have with your health care provider.

## 2014-05-16 LAB — CULTURE, BLOOD (ROUTINE X 2)
Culture: NO GROWTH
Culture: NO GROWTH

## 2014-06-22 LAB — AFB CULTURE WITH SMEAR (NOT AT ARMC): Acid Fast Smear: NONE SEEN

## 2016-08-06 IMAGING — CT CT CHEST W/ CM
1 of 2 series · 14 of 32 positions shown, 18 images · IV contrast (OMNIPAQUE 300)
Comparison: Earlier same date chest radiograph

CLINICAL DATA: Bilateral lower leg weakness.  Slurred speech.

EXAM:
CT CHEST WITH CONTRAST
TECHNIQUE: Multidetector CT imaging of the chest was performed during
intravenous contrast administration.
CONTRAST:  80mL OMNIPAQUE IOHEXOL 300 MG/ML  SOLN

[Series 2: chest with st · axial · 0.67mm/px · z∈[-32,+223]mm · 14 of 61 slices shown, 18 images]
[im 5/61  mediastinal]
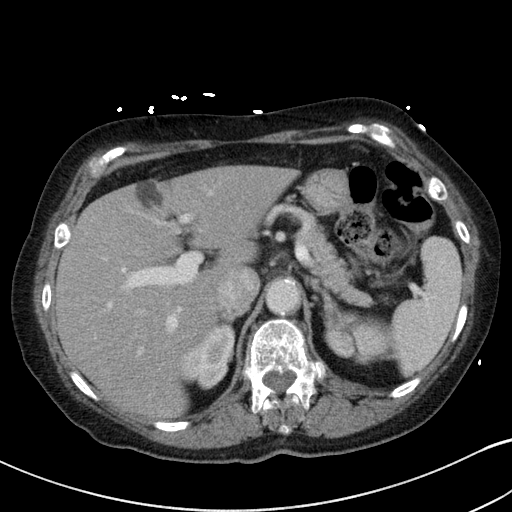
[im 5/61  lung]
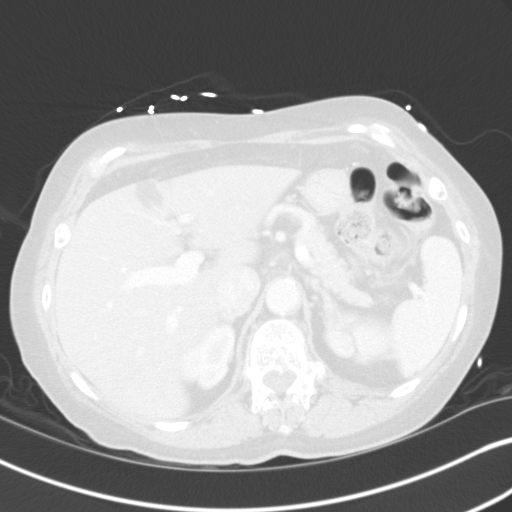
[im 10/61  lung]
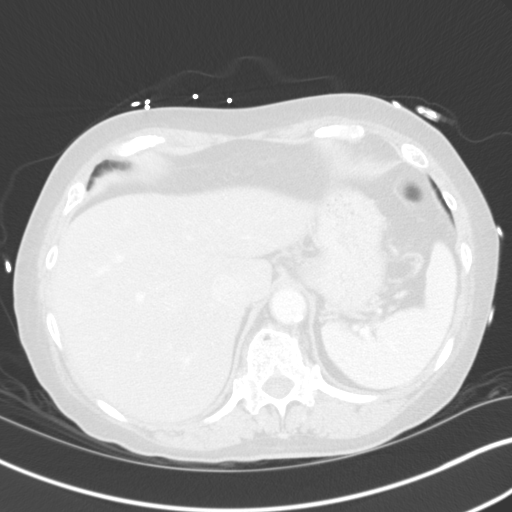
[im 14/61  lung]
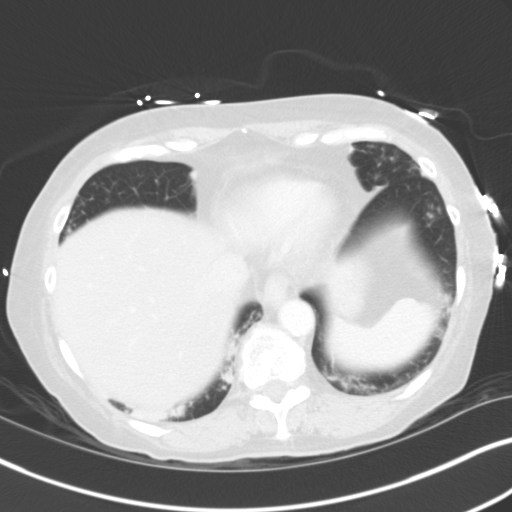
[im 19/61  lung]
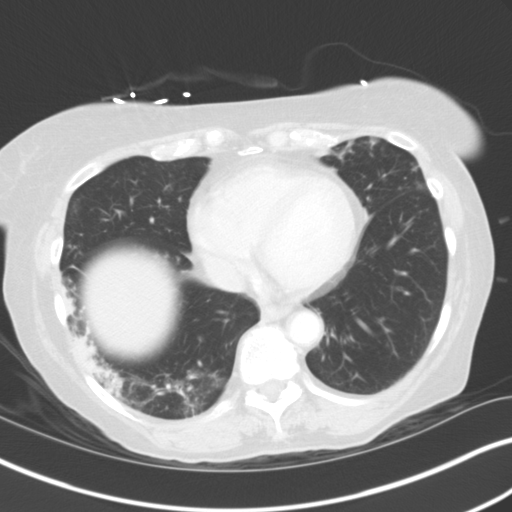
[im 24/61  mediastinal]
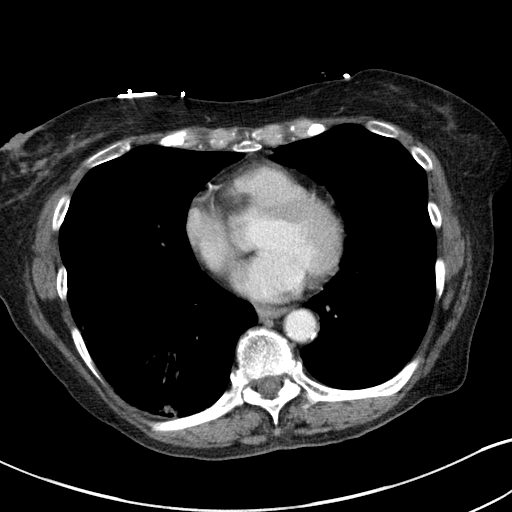
[im 24/61  lung]
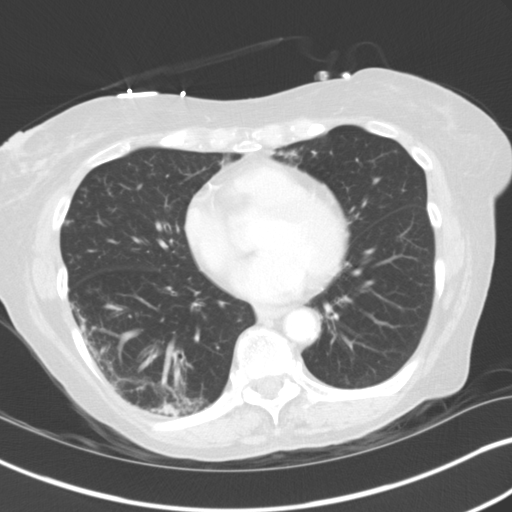
[im 28/61  lung]
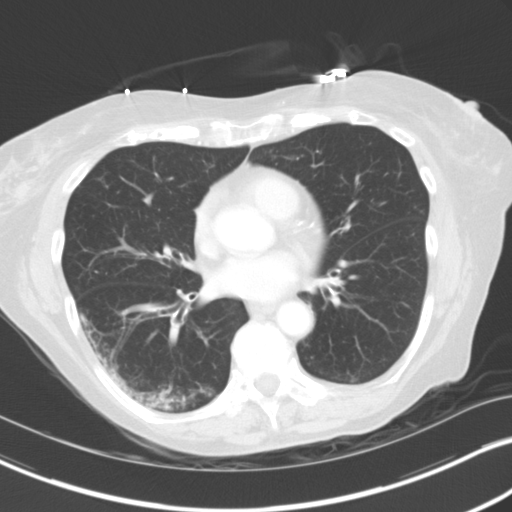
[im 29/61  lung]
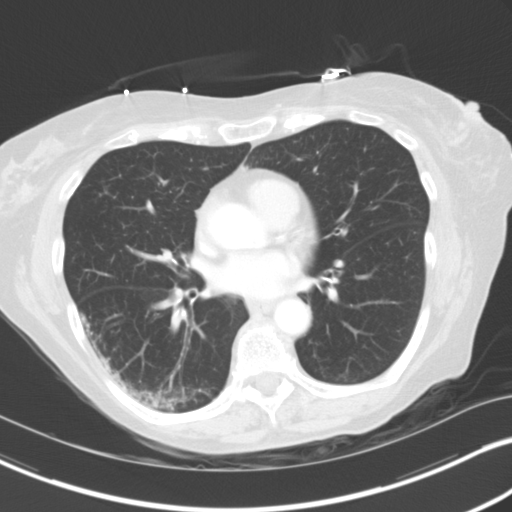
[im 31/61  lung]
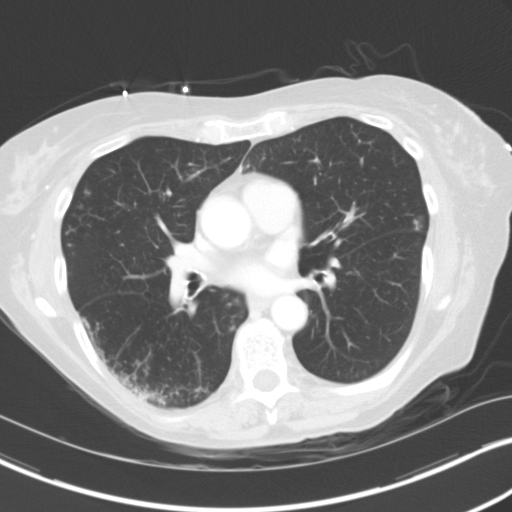
[im 33/61  mediastinal]
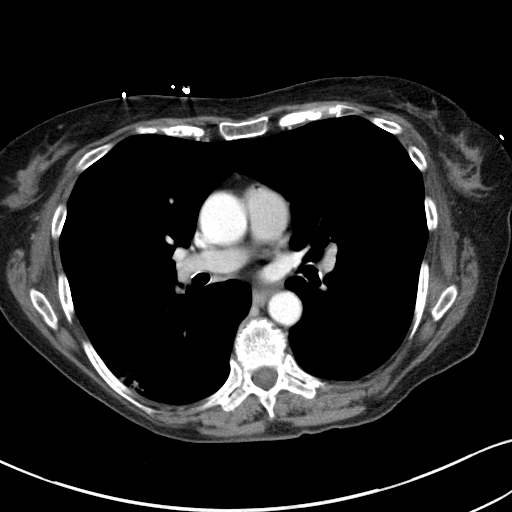
[im 33/61  lung]
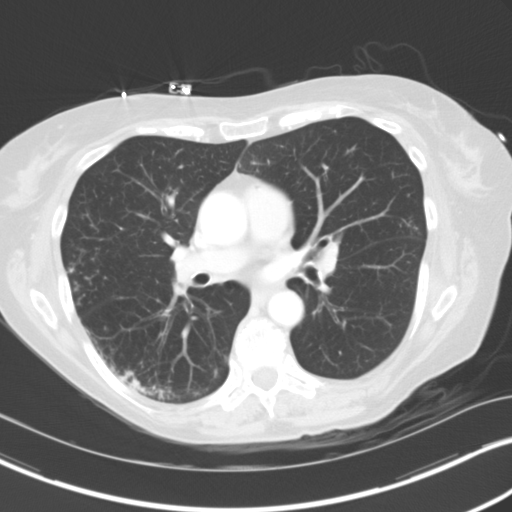
[im 37/61  lung]
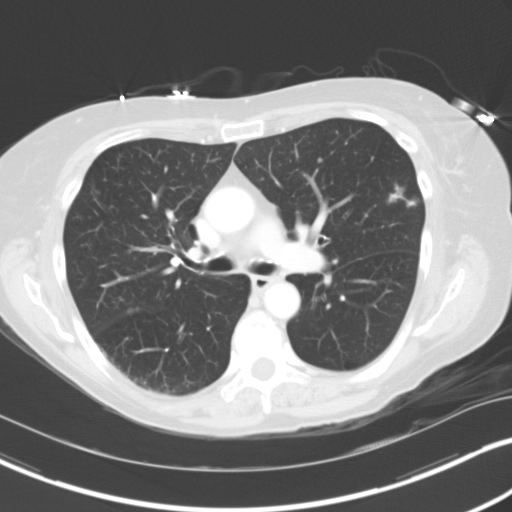
[im 42/61  lung]
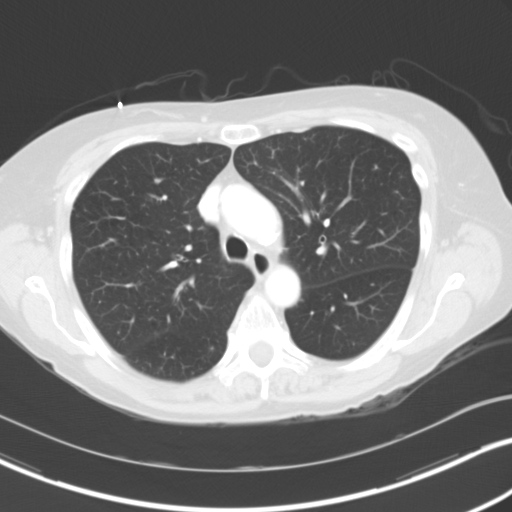
[im 47/61  lung]
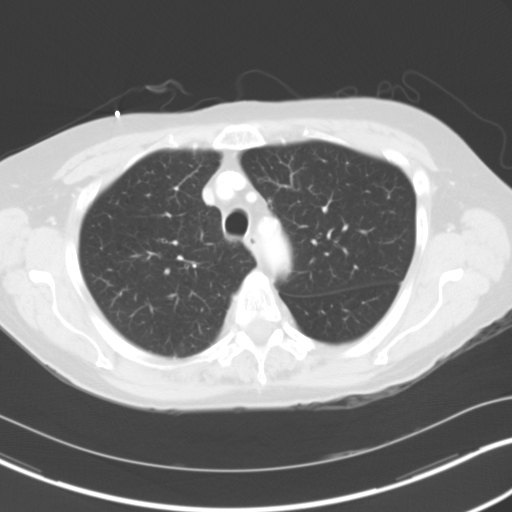
[im 51/61  mediastinal]
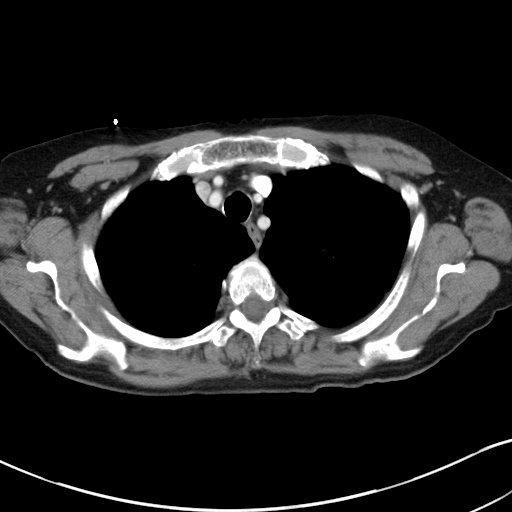
[im 51/61  lung]
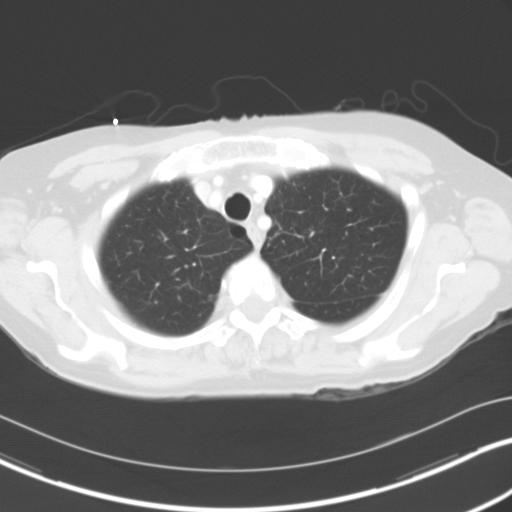
[im 56/61  lung]
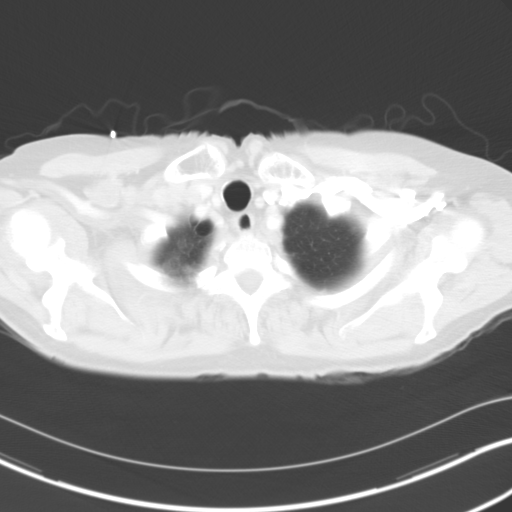

[14 of 32 positions shown; findings below may reference images not displayed]

FINDINGS: Visualized neck base is unremarkable. No enlarged axillary
lymphadenopathy. Prominent right hilar lymph node measuring 0.8 cm.
Normal heart size. No pericardial effusion. Calcified
atherosclerotic plaque involving the coronary arteries.

Central airways are patent. There are tree-in-bud nodular opacities
and associated ground-glass opacities within the subpleural right
lower lobe. There are additional scattered tree-in-bud opacities
within the peripheral right middle lobe, right upper lobe, left
upper lobe and left lower lobes. No pleural effusion or
pneumothorax.

Visualization of the upper abdomen demonstrates fatty deposition
adjacent to the falciform ligament. Post cholecystectomy. Bilateral
low-attenuation renal lesions, too small to accurately characterize.

Mid thoracic spine degenerative change.
IMPRESSION: Right lower lobe consolidative opacity with associated tree-in-bud
nodular opacities within the right lower, left lower, left upper,
right upper and right middle lobes. Overall findings are suggestive
of an airway disseminated infectious process. Recommend short term
radiographic followup to ensure resolution.

Prominent right hilar lymph node, likely reactive in etiology.

## 2016-08-07 IMAGING — CT CT HEAD W/O CM
1 series · 16 of 30 positions shown, 20 images · non-contrast
Comparison: Head CT 05/09/2014

CLINICAL DATA: Sudden onset difficulty in walking. Decreased level
consciousness.

EXAM:
CT HEAD WITHOUT CONTRAST
TECHNIQUE: Contiguous axial images were obtained from the base of the skull
through the vertex without intravenous contrast.

[Series 2: headseq 4.8 h45s · axial · 0.40mm/px · z∈[-112,+19]mm · 16 of 30 slices shown, 20 images]
[im 2/30  brain]
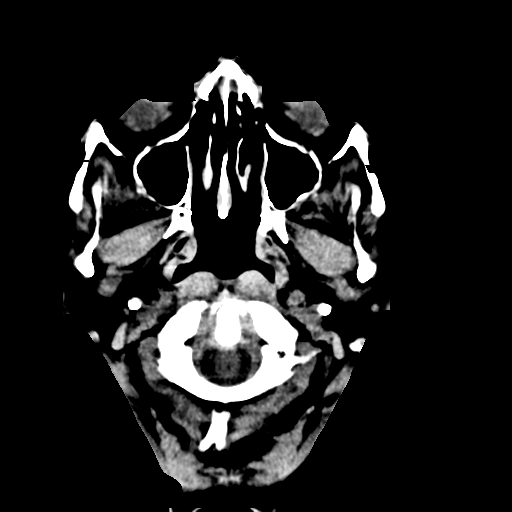
[im 2/30  bone]
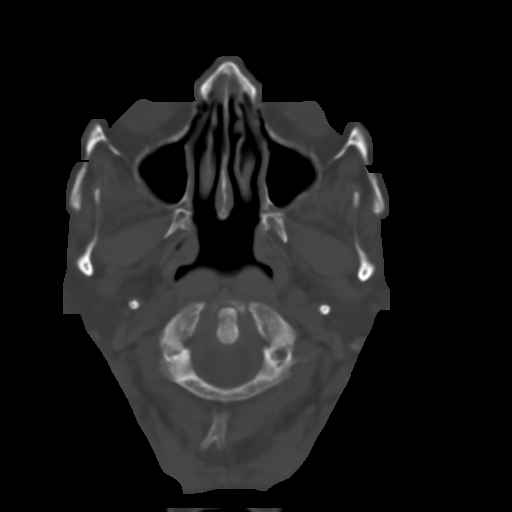
[im 4/30  brain]
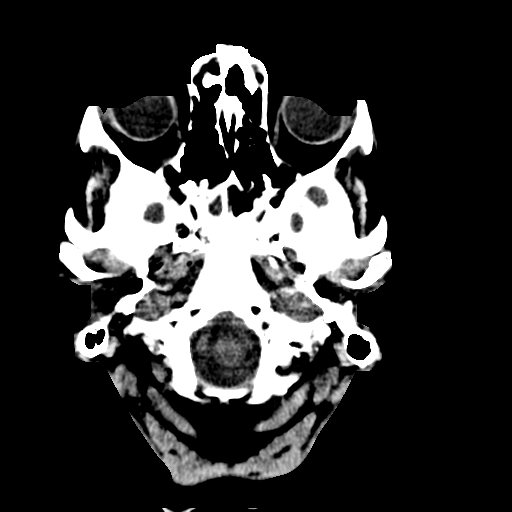
[im 6/30  brain]
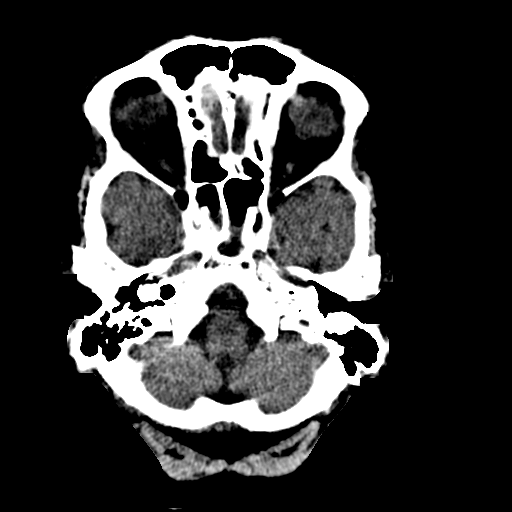
[im 8/30  brain]
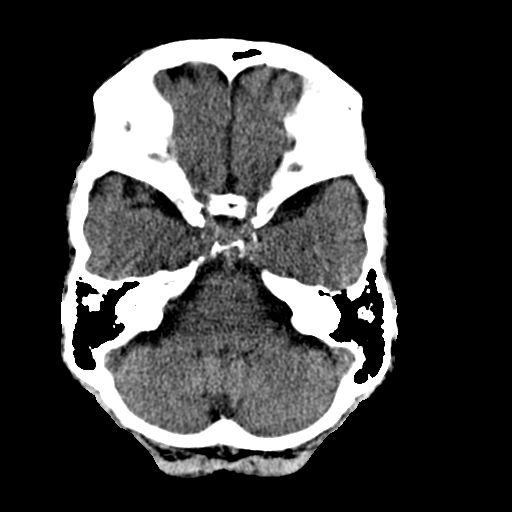
[im 9/30  brain]
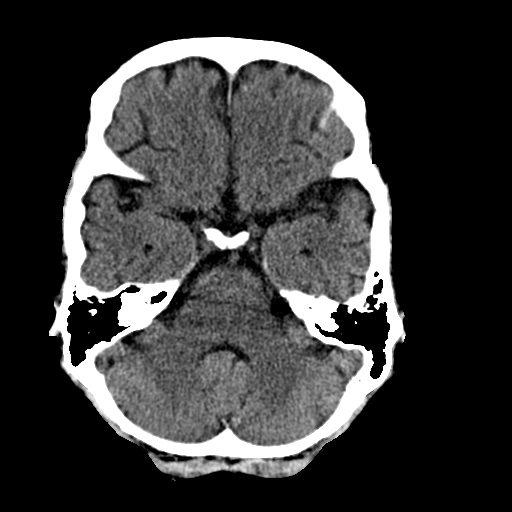
[im 9/30  bone]
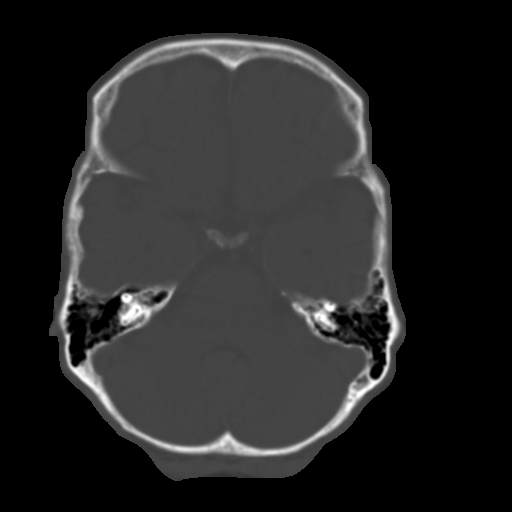
[im 11/30  brain]
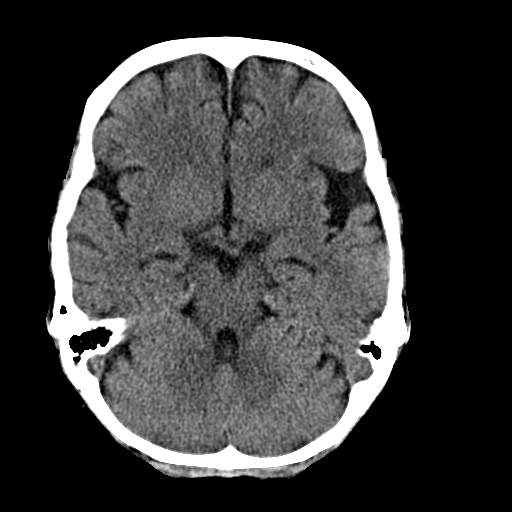
[im 13/30  brain]
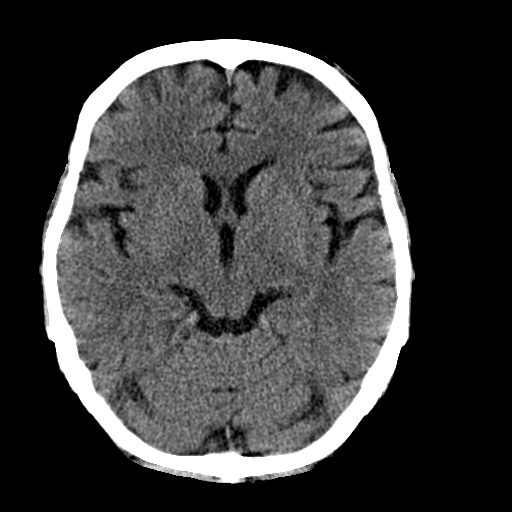
[im 15/30  brain]
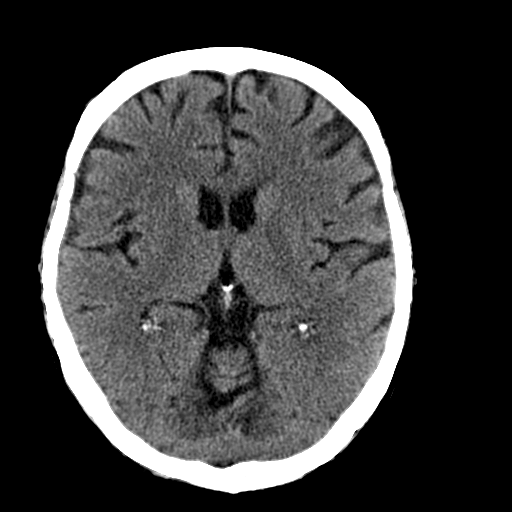
[im 16/30  brain]
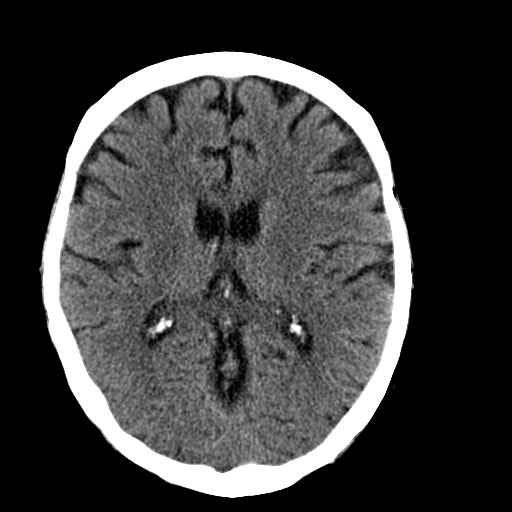
[im 16/30  bone]
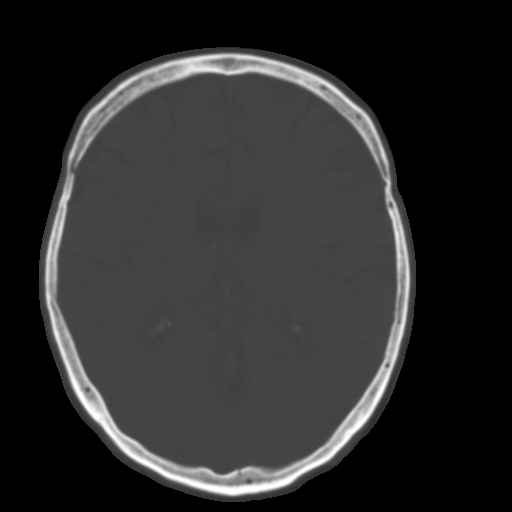
[im 18/30  brain]
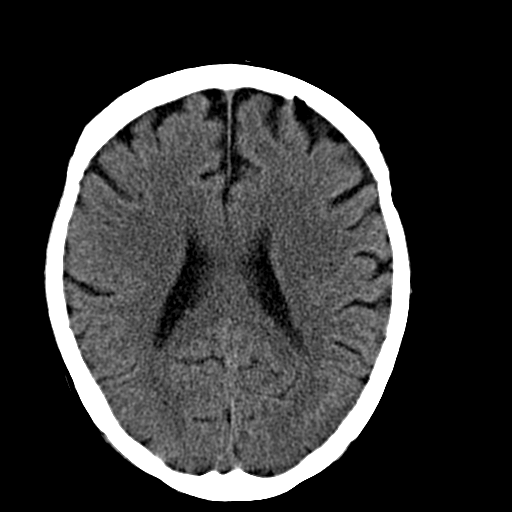
[im 20/30  brain]
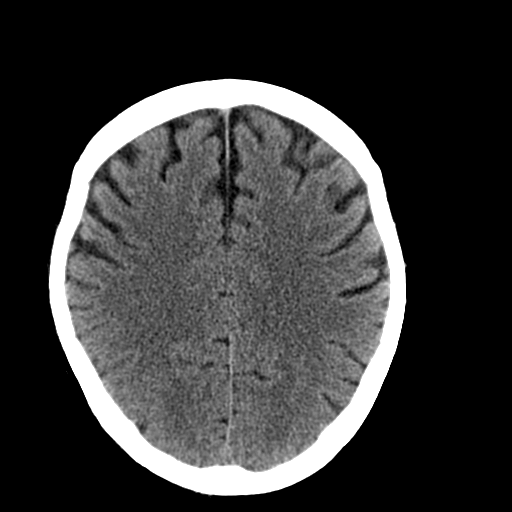
[im 22/30  brain]
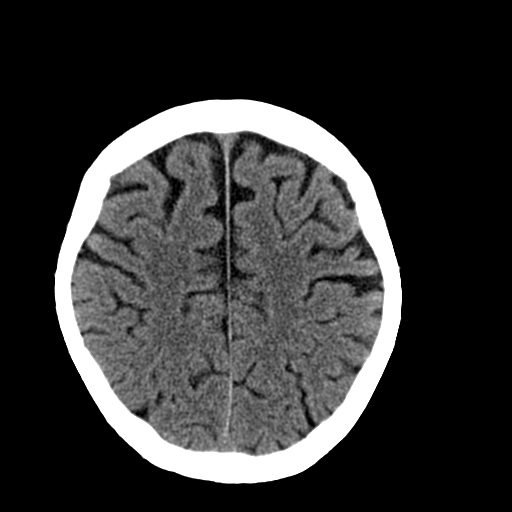
[im 23/30  brain]
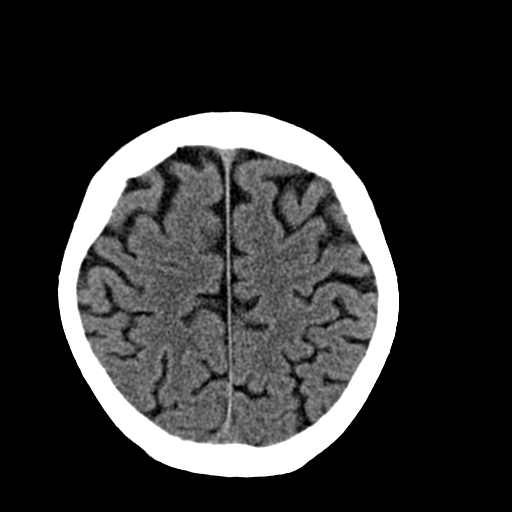
[im 23/30  bone]
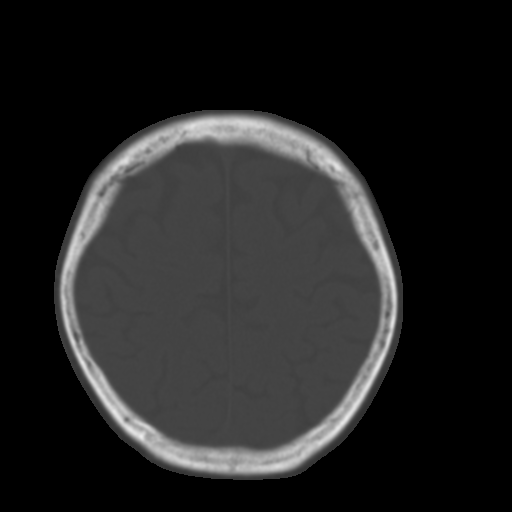
[im 25/30  brain]
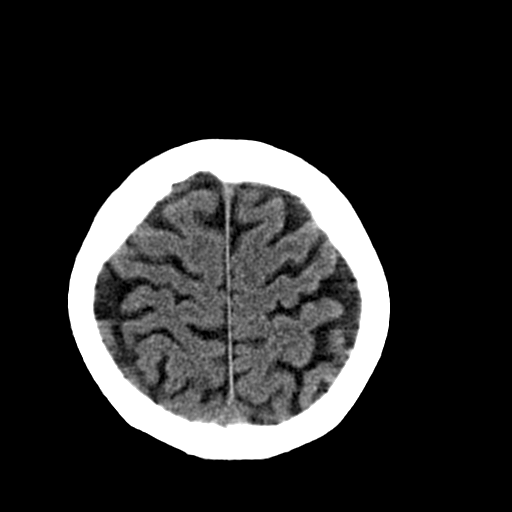
[im 27/30  brain]
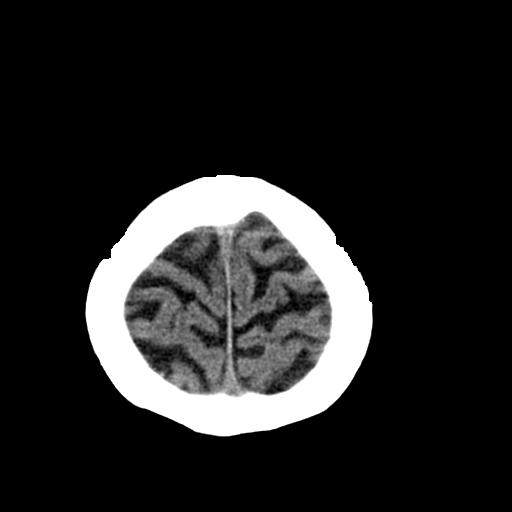
[im 29/30  brain]
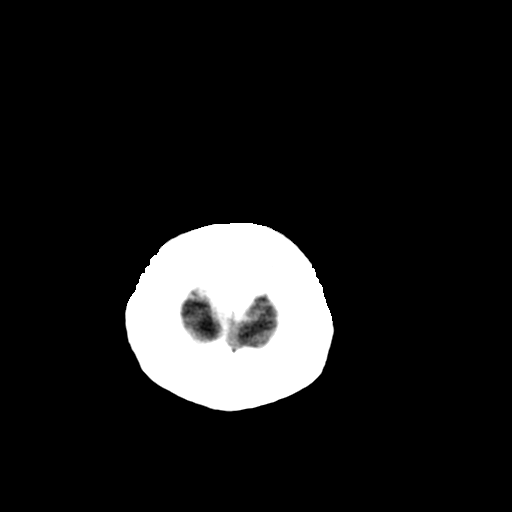

[16 of 30 positions shown; findings below may reference images not displayed]

FINDINGS: No acute intracranial hemorrhage. No focal mass lesion. No CT
evidence of acute infarction. No midline shift or mass effect. No
hydrocephalus. Basilar cisterns are patent.

Mastoid air cells are clear. There is fluid in the sphenoid sinus.
Frontal sinuses are clear.
IMPRESSION: 1. No acute intracranial findings.  No change from prior.
2. Chronic inflammation of the sphenoid sinuses

## 2023-07-21 DEATH — deceased
